# Patient Record
Sex: Female | Born: 1940 | Race: White | Hispanic: No | Smoking: Former smoker
Health system: Southern US, Community
[De-identification: ages and names within clinical notes are randomized; demographics above are authoritative.]

## PROBLEM LIST (undated history)

## (undated) ENCOUNTER — Emergency Department (HOSPITAL_COMMUNITY): Discharge status: Against Medical Advice

## (undated) DIAGNOSIS — E119 Type 2 diabetes mellitus without complications: Secondary | ICD-10-CM

## (undated) DIAGNOSIS — F419 Anxiety disorder, unspecified: Secondary | ICD-10-CM

## (undated) DIAGNOSIS — J449 Chronic obstructive pulmonary disease, unspecified: Secondary | ICD-10-CM

## (undated) DIAGNOSIS — J9610 Chronic respiratory failure, unspecified whether with hypoxia or hypercapnia: Secondary | ICD-10-CM

## (undated) HISTORY — DX: Chronic obstructive pulmonary disease, unspecified: J44.9

---
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
## 1981-02-02 HISTORY — PX: ABDOMINAL HYSTERECTOMY: SHX81

## 2002-06-22 ENCOUNTER — Encounter: Payer: Self-pay | Admitting: Internal Medicine

## 2002-10-10 ENCOUNTER — Inpatient Hospital Stay (HOSPITAL_COMMUNITY): Admission: AD | Admit: 2002-10-10 | Discharge: 2002-10-13 | Payer: Self-pay | Admitting: Internal Medicine

## 2002-10-10 ENCOUNTER — Encounter: Payer: Self-pay | Admitting: Critical Care Medicine

## 2004-01-03 ENCOUNTER — Ambulatory Visit: Payer: Self-pay | Admitting: Internal Medicine

## 2004-04-08 ENCOUNTER — Ambulatory Visit: Payer: Self-pay | Admitting: Internal Medicine

## 2004-07-09 ENCOUNTER — Ambulatory Visit: Payer: Self-pay | Admitting: Internal Medicine

## 2004-10-15 ENCOUNTER — Ambulatory Visit: Payer: Self-pay | Admitting: Internal Medicine

## 2005-01-14 ENCOUNTER — Ambulatory Visit: Payer: Self-pay | Admitting: Internal Medicine

## 2005-04-14 ENCOUNTER — Ambulatory Visit: Payer: Self-pay | Admitting: Internal Medicine

## 2005-07-14 ENCOUNTER — Ambulatory Visit: Payer: Self-pay | Admitting: Internal Medicine

## 2005-08-10 ENCOUNTER — Ambulatory Visit: Payer: Self-pay | Admitting: Pulmonary Disease

## 2005-08-25 ENCOUNTER — Ambulatory Visit: Payer: Self-pay | Admitting: Internal Medicine

## 2005-10-14 ENCOUNTER — Ambulatory Visit: Payer: Self-pay | Admitting: Internal Medicine

## 2005-10-21 ENCOUNTER — Ambulatory Visit: Payer: Self-pay | Admitting: Internal Medicine

## 2006-01-06 ENCOUNTER — Ambulatory Visit: Payer: Self-pay | Admitting: Internal Medicine

## 2006-02-22 ENCOUNTER — Ambulatory Visit: Payer: Self-pay | Admitting: Pulmonary Disease

## 2006-04-28 ENCOUNTER — Ambulatory Visit: Payer: Self-pay | Admitting: Internal Medicine

## 2006-07-30 ENCOUNTER — Ambulatory Visit: Payer: Self-pay | Admitting: Internal Medicine

## 2006-12-21 DIAGNOSIS — J4489 Other specified chronic obstructive pulmonary disease: Secondary | ICD-10-CM | POA: Insufficient documentation

## 2006-12-21 DIAGNOSIS — J449 Chronic obstructive pulmonary disease, unspecified: Secondary | ICD-10-CM | POA: Insufficient documentation

## 2006-12-28 ENCOUNTER — Telehealth (INDEPENDENT_AMBULATORY_CARE_PROVIDER_SITE_OTHER): Payer: Self-pay | Admitting: *Deleted

## 2007-01-12 ENCOUNTER — Ambulatory Visit: Payer: Self-pay | Admitting: Internal Medicine

## 2007-02-21 ENCOUNTER — Telehealth (INDEPENDENT_AMBULATORY_CARE_PROVIDER_SITE_OTHER): Payer: Self-pay | Admitting: *Deleted

## 2007-02-21 ENCOUNTER — Ambulatory Visit: Payer: Self-pay | Admitting: Internal Medicine

## 2007-02-21 DIAGNOSIS — J441 Chronic obstructive pulmonary disease with (acute) exacerbation: Secondary | ICD-10-CM

## 2007-07-13 ENCOUNTER — Ambulatory Visit: Payer: Self-pay | Admitting: Pulmonary Disease

## 2007-07-13 ENCOUNTER — Inpatient Hospital Stay (HOSPITAL_COMMUNITY): Admission: EM | Admit: 2007-07-13 | Discharge: 2007-07-18 | Payer: Self-pay | Admitting: Emergency Medicine

## 2007-07-21 ENCOUNTER — Ambulatory Visit: Admission: RE | Admit: 2007-07-21 | Discharge: 2007-07-21 | Payer: Self-pay | Admitting: Internal Medicine

## 2007-07-21 ENCOUNTER — Ambulatory Visit: Payer: Self-pay | Admitting: Internal Medicine

## 2007-07-28 ENCOUNTER — Ambulatory Visit: Payer: Self-pay | Admitting: Internal Medicine

## 2007-07-29 ENCOUNTER — Encounter: Payer: Self-pay | Admitting: Internal Medicine

## 2007-08-02 ENCOUNTER — Telehealth (INDEPENDENT_AMBULATORY_CARE_PROVIDER_SITE_OTHER): Payer: Self-pay | Admitting: *Deleted

## 2007-08-07 ENCOUNTER — Encounter: Payer: Self-pay | Admitting: Internal Medicine

## 2007-08-10 ENCOUNTER — Ambulatory Visit: Payer: Self-pay | Admitting: Internal Medicine

## 2007-08-10 DIAGNOSIS — R197 Diarrhea, unspecified: Secondary | ICD-10-CM | POA: Insufficient documentation

## 2007-08-17 ENCOUNTER — Telehealth: Payer: Self-pay | Admitting: Internal Medicine

## 2007-08-25 ENCOUNTER — Ambulatory Visit: Payer: Self-pay | Admitting: Internal Medicine

## 2007-08-26 LAB — CONVERTED CEMR LAB
BUN: 9 mg/dL (ref 6–23)
Basophils Relative: 0.7 % (ref 0.0–3.0)
Calcium: 9.4 mg/dL (ref 8.4–10.5)
Creatinine, Ser: 0.6 mg/dL (ref 0.4–1.2)
Eosinophils Absolute: 0 10*3/uL (ref 0.0–0.7)
GFR calc Af Amer: 129 mL/min
GFR calc non Af Amer: 106 mL/min
MCV: 94.8 fL (ref 78.0–100.0)
Monocytes Relative: 2 % — ABNORMAL LOW (ref 3.0–12.0)
Neutrophils Relative %: 88.8 % — ABNORMAL HIGH (ref 43.0–77.0)
Potassium: 4.5 meq/L (ref 3.5–5.1)
Sodium: 138 meq/L (ref 135–145)

## 2007-08-29 ENCOUNTER — Telehealth (INDEPENDENT_AMBULATORY_CARE_PROVIDER_SITE_OTHER): Payer: Self-pay | Admitting: *Deleted

## 2007-09-27 ENCOUNTER — Ambulatory Visit: Payer: Self-pay | Admitting: Internal Medicine

## 2007-09-30 ENCOUNTER — Encounter: Payer: Self-pay | Admitting: Internal Medicine

## 2007-10-18 ENCOUNTER — Ambulatory Visit: Payer: Self-pay | Admitting: Internal Medicine

## 2007-10-18 ENCOUNTER — Telehealth (INDEPENDENT_AMBULATORY_CARE_PROVIDER_SITE_OTHER): Payer: Self-pay | Admitting: *Deleted

## 2007-10-27 ENCOUNTER — Ambulatory Visit: Payer: Self-pay | Admitting: Internal Medicine

## 2007-10-28 ENCOUNTER — Encounter: Payer: Self-pay | Admitting: Pulmonary Disease

## 2007-11-09 ENCOUNTER — Telehealth (INDEPENDENT_AMBULATORY_CARE_PROVIDER_SITE_OTHER): Payer: Self-pay | Admitting: *Deleted

## 2007-11-10 ENCOUNTER — Ambulatory Visit: Payer: Self-pay | Admitting: Internal Medicine

## 2007-12-20 ENCOUNTER — Ambulatory Visit: Payer: Self-pay | Admitting: Internal Medicine

## 2008-02-01 ENCOUNTER — Ambulatory Visit: Payer: Self-pay | Admitting: Internal Medicine

## 2008-04-17 ENCOUNTER — Ambulatory Visit: Payer: Self-pay | Admitting: Internal Medicine

## 2008-07-24 ENCOUNTER — Ambulatory Visit: Payer: Self-pay | Admitting: Internal Medicine

## 2008-10-24 ENCOUNTER — Ambulatory Visit: Payer: Self-pay | Admitting: Internal Medicine

## 2009-01-08 ENCOUNTER — Encounter: Payer: Self-pay | Admitting: Internal Medicine

## 2009-01-08 ENCOUNTER — Telehealth (INDEPENDENT_AMBULATORY_CARE_PROVIDER_SITE_OTHER): Payer: Self-pay | Admitting: *Deleted

## 2009-02-05 ENCOUNTER — Encounter: Payer: Self-pay | Admitting: Internal Medicine

## 2009-04-19 ENCOUNTER — Ambulatory Visit: Payer: Self-pay | Admitting: Internal Medicine

## 2010-03-06 NOTE — Letter (Signed)
Summary: CMN for Oxygen/[facility]  CMN for Oxygen/[facility]   Imported By: Sherian Rein 02/08/2009 11:07:05  _____________________________________________________________________  External Attachment:    Type:   Image     Comment:   External Document

## 2010-03-06 NOTE — Assessment & Plan Note (Signed)
Summary: Pulmonary/ f/u yearly is ok   Primary Provider/Referring Provider:  Jeannetta Nap  CC:  Followup.  Pt states that her breathing has been doing well.  She states that her cough has been worsening since Dec 2010.  Cough is prod with cream colored sputum.Marland Kitchen  History of Present Illness:  67-yowf quit smoking Dec 96 with severe but well compensated COPD  Baseline activity level: grocery shopping, light housework, trash to curb. slow walking 100 ft without stopping. on average uses rescue 2x wk.   Had exacerbation with hospitalization  07/13/07 -07/18/07  required daily prednisone since then  November 10, 2007  ov:  sputum cleared to white taper prednisone to 10 mg one half daily  December 20, 2007 tol tapered to 10 mg one half daily, no more cough,  baseline  doe on 02  so decrease one half every other day but gradually getting worse esp energy so back up to ceiling of 20 and back to 5 mg per day.  02/01/08 ov maintaining prednisone floor of 10 mg one half daily with no change in dyspnea or cough.   April 17, 2008 ov mucus brown > white after levaquin and now prednione at 10mg  one daily but doe > baseline, congested cough > baseline, not following her med calendar continued see instructions because she is "just getting over some virus"  (? GI) . Patient failed to answer a single question asked in a straightforward manner, tending to go off on tangents or answer questions with ambiguous medical terms or diagnoses and seemed upset when asked the same question more than once for clarification.   July 24, 2008--Presents for follow up and med review. Brought all meds   October 24, 2008 found floor 10-5 works the best with only one episode where used 20 mg of prednsione per day since last ov.  April 19, 2009 Followup.  Pt states that her breathing has been doing well.  She states that her cough has been worsening since Dec 2010.  Cough is prod with cream colored sputum. Pt denies any significant sore  throat, dysphagia, itching, sneezing,  nasal congestion or excess secretions,  fever, chills, sweats, unintended wt loss, pleuritic or exertional cp, hempoptysis, change in activity tolerance  orthopnea pnd or leg swelling   Current Medications (verified): 1)  Oxygen 2 Liters .... Continuously 2)  Spiriva Handihaler 18 Mcg  Caps (Tiotropium Bromide Monohydrate) .... Inhale Contents of 1 Capsule Once A Day 3)  Protonix 40 Mg  Pack (Pantoprazole Sodium) .... Take 1 Tablet By Mouth Once A Day 4)  Symbicort 160-4.5 Mcg/act  Aero (Budesonide-Formoterol Fumarate) .... Inhale 2 Puffs Two Times A Day 5)  Centrum Silver   Tabs (Multiple Vitamins-Minerals) .... Take 1 Tablet By Mouth Once A Day 6)  Calcium 600-D 600-400 Mg-Unit  Tabs (Calcium Carbonate-Vitamin D) .Marland Kitchen.. 1 By Mouth Two Times A Day 7)  Paroxetine Hcl 10 Mg  Tabs (Paroxetine Hcl) .... 1/2 Tab By Mouth Once Daily 8)  Prednisone 10 Mg  Tabs (Prednisone) .... Take As Directed Once A Day or As Directed On Bottom of Medication Calendar 9)  Claritin 10 Mg Tabs (Loratadine) .... Take 1 Tab By Mouth At Bedtime As Needed 10)  Mycelex 10 Mg  Troc (Clotrimazole) .Marland Kitchen.. 1 Every 6 Hours As Needed For 7 Days 11)  Mucinex D [phone] Mg  Tb12 (Pseudoephedrine-Guaifenesin) .Marland Kitchen.. 1-2 Every 12 Hours As Needed 12)  Tylenol Extra Strength 500 Mg  Tabs (Acetaminophen) .... Take As Directed When  Needed 13)  Levaquin 750 Mg Tabs (Levofloxacin) .Marland Kitchen.. 1 By Mouth For 5 Days As Needed 14)  Flunisolide 0.025 % Soln (Flunisolide) .... 2 Puffs Every 12 Hours As Needed 15)  Proair Hfa 108 (90 Base) Mcg/act  Aers (Albuterol Sulfate) .... 2 Puffs Every Four Hours As Needed 16)  Flutter Valve .... Use Every 4 Hours As Needed 17)  Prednisone 10 Mg Tabs (Prednisone) .... 2 Tabs Daily Until Better Then 1 Tab Daily X7 Days, 1/2 Tab Daily 18)  Metformin Hcl 500 Mg Tabs (Metformin Hcl) .... 2 Every Am and 1 Every Evening  Allergies (verified): 1)  ! Vicodin 2)  ! Asa 3)  ! *  Patanol 4)  ! Sulfa 5)  ! Naprosyn  Past History:  Past Medical History: CHRONIC OBSTRUCTIVE PULMONARY DISEASE, SEVERE (ICD-496)   - PFTs 10/14/05 FEV1 23% ratio 23% diffusing capacity 51%   - no longterm vent 10/15/03 requested   - 02 dep 6/09   - prednisone dep since exac 6/09 Health Maintenance........................................................................Marland KitchenElkins   - Pneumovax 6/09   - Med calendar written 6/09  Vital Signs:  Patient profile:   70 year old female Weight:      110 pounds BMI:     18.37 O2 Sat:      93 % on 2 L/min Temp:     97.7 degrees F oral Pulse rate:   118 / minute BP sitting:   136 / 62  (left arm)  Vitals Entered By: Vernie Murders (April 19, 2009 2:16 PM)  O2 Flow:  2 L/min  Physical Exam  Additional Exam:  wt 120  Dec 20 2007 > 121 02/01/08 > 118 April 17, 2008 >>116 July 24, 2008 > 118 October 24, 2008  > 120 April 19, 2009   amb chronically ill minimally cushingnoid wf nad  HEENT mild turbinate edema.  Oropharynx no thrush or excess pnd or cobblestoning.  No JVD or cervical adenopathy. Mild accessory muscle hypertrophy. Trachea midline, nl thryroid. Chest was hyperinflated by percussion with diminished breath sounds and marked increased exp time without wheeze. Hoover sign positive onset of inspiration. Regular rate and rhythm without murmur gallop or rub or increase P2. Decrease s1s2  no edema Abd: no hsm, nl excursion. Ext warm without cyanosis or clubbing      Impression & Recommendations:  Problem # 1:  COPD UNSPECIFIED (ICD-496) Still steroid dependent   The goal with a chronic steroid dependent illness is always arriving at the lowest effective dose that controls the disease/symptoms and not accepting a set "formula" which is based on statistics that don't take into accound individual variability or the natural hx of the dz in every individual patient, which may well vary over time. For now ceiling and floor adjusted per  instructions  Each maintenance medication was reviewed in detail including most importantly the difference between maintenance and as needed and under what circumstances the prns are to be used.  In addition, these two groups of medications (for which the patient should keep up with refills) were distinguished from a third group, namely those meds that are used only short term with the intent to complete a course of therapy and then not refill them without further evaluation. See instructions for specific recommendations   Medications Added to Medication List This Visit: 1)  Levaquin 750 Mg Tabs (Levofloxacin) .Marland Kitchen.. 1 by mouth for 5 days as needed 2)  Metformin Hcl 500 Mg Tabs (Metformin hcl) .... 2 every am and 1 every  evening  Other Orders: Est. Patient Level III (04540)  Patient Instructions: 1)  See calendar for specific medication instructions and bring it back for each and every office visit for every healthcare provider you see.  Without it,  you may not receive the best quality medical care that we feel you deserve.  2)  ceiling for prednisone is 20 mg per day and floor is 10 alternating with 5 per day Prescriptions: LEVAQUIN 750 MG TABS (LEVOFLOXACIN) 1 by mouth for 5 days as needed  #5 x 5   Entered and Authorized by:   Nyoka Cowden MD   Signed by:   Nyoka Cowden MD on 04/19/2009   Method used:   Electronically to        [facility]* (retail)       [address]       [place], [place]  [id number]       Ph: [phone]       Fax: [phone]   RxID:   [id number]

## 2010-04-28 ENCOUNTER — Encounter: Payer: Self-pay | Admitting: Internal Medicine

## 2010-04-29 ENCOUNTER — Encounter: Payer: Self-pay | Admitting: Internal Medicine

## 2010-04-29 ENCOUNTER — Ambulatory Visit (INDEPENDENT_AMBULATORY_CARE_PROVIDER_SITE_OTHER): Payer: Medicare Other | Admitting: Internal Medicine

## 2010-04-29 VITALS — BP 130/60 | HR 125 | Temp 97.9°F | Ht 65.0 in | Wt 112.8 lb

## 2010-04-29 DIAGNOSIS — J449 Chronic obstructive pulmonary disease, unspecified: Secondary | ICD-10-CM

## 2010-04-29 MED ORDER — LEVOFLOXACIN 750 MG PO TABS: 750.0000 mg | ORAL_TABLET | Freq: Every day | ORAL | Status: AC

## 2010-04-29 NOTE — Progress Notes (Signed)
  Subjective:    Patient ID: Darlene [name], female    DOB: May 02, 1940, 70 y.o.   MRN: [id number]  HPI  85 yowf  quit smoking Dec 96 with severe but well compensated COPD Baseline activity level: grocery shopping, light housework, trash to curb. slow walking 100 ft without stopping. on average uses rescue 2x wk.  Had exacerbation with hospitalization 07/13/07 -07/18/07 required daily prednisone since then    December 20, 2007 tol tapered to 10 mg one half daily, no more cough, baseline doe on 02 so decrease one half every other day but gradually getting worse esp energy so back up to ceiling of 20 and back to 5 mg per day.   02/01/08 ov maintaining prednisone floor of 10 mg one half daily with no change in dyspnea or cough.    July 24, 2008--Presents for follow up and med review. Brought all meds   October 24, 2008 found floor 10-5 works the best with only one episode where used 20 mg of prednsione per day since last ov > no change rx  April 19, 2009 Followup. Pt states that her breathing has been doing well.  rec no change rx  04/29/2010 ov cc 02 dep resp failure @ 2lpm with floor of prednisone 11-06-08 and minimal use of prns but persistent congested cough not fully utilizing med calendar prns in max rec doses.   Pt denies any significant sore throat, dysphagia, itching, sneezing,  nasal congestion or excess/ purulent secretions,  fever, chills, sweats, unintended wt loss, pleuritic or exertional cp, hempoptysis, orthopnea pnd or leg swelling.    Also denies any obvious fluctuation of symptoms with weather or environmental changes or other aggravating or alleviating factors.       Past Medical History:  CHRONIC OBSTRUCTIVE PULMONARY DISEASE, SEVERE (ICD-496)  - PFTs 10/14/05 FEV1 23% ratio 23% diffusing capacity 51%  - no longterm vent 10/15/03 requested  - 02 dep 07/2007  - prednisone dep since exac 6/09  Health  Maintenance........................................................................Marland KitchenElkins  - Pneumovax 07/2007 - Med calendar written 6/09         Review of Systems     Objective:   Physical Exam    wt 120 Dec 20 2007 > 121 02/01/08 > 118 April 17, 2008 >> > 120 April 19, 2009 > 112 04/29/2010  amb chronically ill minimally cushingnoid wf nad  HEENT mild turbinate edema. Oropharynx no thrush or excess pnd or cobblestoning. No JVD or cervical adenopathy. Mild accessory muscle hypertrophy. Trachea midline, nl thryroid. Chest was hyperinflated by percussion with diminished breath sounds and marked increased exp time without wheeze. Hoover sign positive onset of inspiration. Regular rate and rhythm without murmur gallop or rub or increase P2. Decrease s1s2 no edema Abd: no hsm, nl excursion. Ext warm without cyanosis or clubbing    Assessment & Plan:

## 2010-04-29 NOTE — Patient Instructions (Signed)
See calendar for specific medication instructions and bring it back for each and every office visit for every healthcare provider you see.  Without it,  you may not receive the best quality medical care that we feel you deserve.  You will note that the calendar groups together  your maintenance  medications that are timed at particular times of the day.  Think of this as your checklist for what your doctor has instructed you to do until your next evaluation to see what benefit  there is  to staying on a consistent group of medications intended to keep you well.  The other group at the bottom is entirely up to you to use as you see fit  for specific symptoms that may arise between visits that require you to treat them on an as needed basis.  Think of this as your action plan or "what if" list.   Separating the top medications from the bottom group is fundamental to providing you adequate care going forward.    Bring you medications with you (all active medications !) when you see Tammy in 3 months and she will write a new calendar for you.

## 2010-04-29 NOTE — Assessment & Plan Note (Addendum)
GOLD IV 02 and steroid dependent.  I had an extended discussion with the patient today lasting 15 to 20 minutes of a 25 minute visit on the following issues:  The goal with a chronic steroid dependent illness is always arriving at the lowest effective dose that controls the disease/symptoms and not accepting a set "formula" which is based on statistics or guidelines that don't always take into account patient  variability or the natural hx of the dz in every individual patient, which may well vary over time.  For now therefore I recommend the patient maintain  For now the floor is 11-06-08. The ceiling is 20 mg per day.    Each maintenance medication was reviewed in detail including most importantly the difference between maintenance and as needed and under what circumstances the prns are to be used.   This was done in the context of a medication calendar review which provided the patient with a user-friendly unambiguous mechanism for medication administration and reconciliation and provides an action plan for all active problems. It is critical that this be shown to every doctor  for modification during the office visit if necessary so the patient can use it as a working document.   She needs to implement max doses and understand how to use the prns better.  See instructions  .

## 2010-05-09 ENCOUNTER — Other Ambulatory Visit: Payer: Self-pay | Admitting: Internal Medicine

## 2010-05-13 ENCOUNTER — Telehealth: Payer: Self-pay | Admitting: Internal Medicine

## 2010-05-13 MED ORDER — TIOTROPIUM BROMIDE MONOHYDRATE 18 MCG IN CAPS: ORAL_CAPSULE | RESPIRATORY_TRACT | Status: DC

## 2010-05-13 NOTE — Telephone Encounter (Signed)
Advised pt rx was sent to pharmacy and she verbalized understanding

## 2010-06-10 ENCOUNTER — Other Ambulatory Visit: Payer: Self-pay | Admitting: Internal Medicine

## 2010-06-17 NOTE — H&P (Signed)
NAMEHARVEY, LINGO NO.:  [id number]   MEDICAL RECORD NO.:  [id number]          PATIENT TYPE:  EMS   LOCATION:  ED                           FACILITY:  [facility]   PHYSICIAN:  Oretha Milch, MD      DATE OF BIRTH:  01/28/1941   DATE OF ADMISSION:  07/13/2007  DATE OF DISCHARGE:                              HISTORY & PHYSICAL   REFERRING PHYSICIAN:  Emergency room.   REASON FOR ADMISSION:  COPD exacerbation.   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  PFTs in September, 2007  have shown severe GOLD stage IV with an FEV1 of 23% (1.3 liters).  She  has been maintained on a regimen of Spiriva, Symbicort, and ProAire for  breakthrough symptoms.  The last exacerbation was in January, 2009,  which improved with steroids and antibiotics.  She quit smoking in  December, 1996 after a bronchitic exacerbation but was smoking about  three packs per day at the time of quitting.  She smoked for about 32  years.  She got sick a few days ago with a URI, which she believes she  may have contracted from another gentleman in a doctor's office.  It has  since settled in her chest and caused chest tightness and wheezing from  the past day.  She called the office but apparently could not be worked  in for an appointment.   On arrival today, patient was significantly short of breath.  Blood  pressure 157/95, heart rate 132.  She received 125 mg of Solu-Medrol,  400 mg of IV Avelox.  She has received two albuterol nebs in the past  four hours and an Atrovent neb.  She states that her dyspnea is somewhat  improved but complains of lightheadedness.  She is worried that her  heart rate and blood pressure are elevated.  A spiral CT was performed  which showed severe central lobular emphysema and faint ground glass  infiltrate in lingula.   PAST MEDICAL HISTORY:  Severe COPD, as described above.   ALLERGIES:   VICODIN, ASPIRIN, SULFA, NAPROXEN, PANTANOL.   FAMILY HISTORY:  Diabetes 2 in her mother.  One maternal uncle with  emphysema, who smoked.   CURRENT MEDICATIONS:  1. Spiriva HandiHaler daily.  2. Symbicort 160/4.5 2 puffs twice daily.  3. Protonix 40 mg once daily, which she doubles up when she takes      prednisone.  4. ProAire HFA as needed.  5. Oral Menest 0.625 mg once daily.  6. Centrum oral.  7. Nizoral 29 mcg daily.   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  Smoked for 30 years, three packs per day.   REVIEW OF SYSTEMS:  Reports lightheadedness.  Denies palpitations, chest  pain, paroxysmal nocturnal dyspnea, or orthopnea.  Reports no sleep.  Dry and occasionally low phlegm.  Denies abdominal pain.   PHYSICAL EXAMINATION:  A tall woman sitting up in bed in moderate  respiratory distress.  Heart rate 123 per  minute, sinus on the monitor, blood pressure 128/88,  respirations 22 per minute.  Oxygen saturation 95% on 2 liters nasal  cannula.  HEENT:  Erythematous pharynx.  No postnasal drip.  No exudates.  No  thrush.  NECK:  Supple.  No JVD.  No lymphadenopathy.  CVS:  S1 and S2 tachy.  Distant.  CHEST:  Decreased breath sounds above the bases.  A few crackles in  bases and faint scattered rhonchi.  ABDOMEN:  Soft and nontender, no organomegaly.  NEUROLOGIC:  Nonfocal.  EXTREMITIES:  No edema.   LAB DATA:  Sodium 134, potassium 4.2, chloride 100, BUN and creatinine 7  and 0.8, glucose 121.  WBC count 6.9, hemoglobin 15.9, platelets 209,  MCV 93, 82% segs.  Troponin 0.05.   IMPRESSION:  1. Chronic obstructive pulmonary disease exacerbation.  2. I am not impressed with the lingular ground glass infiltrates      described on the spiral CT scan.  3. Severe GOLD (global obstructive lung disease) stage IV chronic      obstructive pulmonary disease.  4. Sinus tachycardia related to work of breathing and albuterol      nebulizers.    RECOMMENDATIONS:  1. We will admit her to telemetry, given her lightheadedness and      significant tachycardia.  2. IV Solu-Medrol 60 mg q.8h.  3. IV Avelox 400 mg daily for bronchitis.  4. We will continue her Symbicort Spiriva.  Will use albuterol nebs 3      times a day and q.4h. as needed.  5. A 12-lead EKG will be checked.  6. We will provide her with a bedside commode to prevent      decompensation when she has to get out of the bed.  7. Subcu Lovenox will be used for DVT prophylaxis.  We will double up      her Protonix or GI prophylaxis while she is on steroids, based on      her prior experience.      Oretha Milch, MD  Electronically Signed     RVA/MEDQ  D:  07/13/2007  T:  07/13/2007  Job:  [id number]

## 2010-06-17 NOTE — Discharge Summary (Signed)
[name], [name]                  ACCOUNT NO.:  [id number]   MEDICAL RECORD NO.:  [id number]          PATIENT TYPE:  INP   LOCATION:  1414                         FACILITY:  [facility]   PHYSICIAN:  Casimiro Needle B. Sherene Sires, MD, FCCPDATE OF BIRTH:  24-Mar-1940   DATE OF ADMISSION:  07/13/2007  DATE OF DISCHARGE:  07/18/2007                               DISCHARGE SUMMARY   DISCHARGE DIAGNOSIS:  Acute on chronic respiratory failure secondary to  emphysema with acute exacerbation and now hypoxic respiratory failure,  oxygen dependent.   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  Pulmonary function  tests in September 2007 have shown severe GOLD stage IV with FEV1 of 23%  1.3 liters.  She is maintained on pharmaceutical regimen as noted.  She  has a long history of smoking.  She presented to the office on July 13, 2007, with acute exacerbation of her lung disease, was admitted for  further evaluation and treatment.   LABORATORY DATA:  Note she had CT performed which showed severe central  lobar emphysema and faint ground glass infiltrates in the lingula.   Hemoglobin 13.6, hematocrit 39.5, wbc 9.4, platelets 190.  Sodium 140,  potassium 4.3, chloride 101, CO2 34, BUN 20, creatinine 0.88, glucose  183, calcium 8.8.  Influenza A and B were both negative.  CK-MB was less  than 0.05, CK-MB was 1.8.  Myoglobin is 182.  D-dimer was noted to be  0.73.   HOSPITAL COURSE BY DISCHARGE DIAGNOSIS:  Acute on chronic respiratory  failure with severe emphysema with acute exacerbation and now hypoxic  respiratory failure on current oxygen.  Darlene [name] was admitted to [facility] and treated with the usual pharmaceutical interventions of  IV steroids, IV antibiotics.  She reached maximum hospital benefit by  July 18, 2007.  She was ambulated in the hall prior to discharge and  found on room air to desaturate to 84%.   Therefore, she will be  discharged on home O2 at 2 liters.  She will follow up with Dr. Sandrea Hughs on July 21, 2007, for further evaluation and treatment.   DISCHARGE MEDICATIONS:  1. Spiriva 18 mcg inhaled once daily.  2. Nasarel 2 twice daily  3. Protonix 40 mg daily twice a day if on prednisone.  4. Symbicort 164.5 twice daily.  5. Centrum 1 tablet daily.  6. ProAir HFA as need every 6 hours.  7. Menest 0.625 mg daily.  8. She has continued to use the Nystatin she has at home as      instructed.  9. Home O2 at 2 liters 24 hours a day.  10.Avelox 400 mg one a day until gone.  11.Prednisone on a tapered 30 mg x3 days, 20 mg x3 days, 10 mg x3      days, then stop.   DIET:  She should be on a regular diet.   FOLLOW UP:  She will follow up with Dr. Sandrea Hughs on July 16, 2007,  at 3:15 p.m.  Devra Dopp, MSN, ACNP      Darlene [name]. Sherene Sires, MD, [facility]  Electronically Signed    SM/MEDQ  D:  07/18/2007  T:  07/18/2007  Job:  [id number]

## 2010-06-17 NOTE — Assessment & Plan Note (Signed)
Darlene [name]                             PULMONARY OFFICE NOTE   NAME:[name], [name]                         MRN:          [id number]  DATE:07/30/2006                            DOB:          [birth year]    HISTORY:  A 70 year old white female who is presumed to have  predominantly emphysematous COPD by PFTs as recently as September 2007,  but who has significantly improved on Symbicort 160/4.5 two puffs b.i.d.  over Advair (some of this benefit may have been the lack of Advair's  adverse effect on the upper airway, however).  However, she comes back  all smiles today stating that she rarely needs any albuterol now and is  able to enjoy ADLs as long as she takes her time.   She denies any exertional chest pain, orthopnea, PND, or leg swelling,  fevers, chills, sweats, or nocturnal respiratory complaints.   For full inventory of medications, please see face sheet dated July 30, 2006.   PHYSICAL EXAMINATION:  She is a pleasant, but slightly anxious  ambulatory white female in no acute distress.  VITAL SIGNS:  Stable vital signs.  HEENT:  Unremarkable.  Oropharynx is clear.  LUNGS:  The lung fields reveal diminished breath sounds bilaterally but  no wheezing.  CARDIAC:  Regular rate and rhythm without murmur, gallop, or rub.  ABDOMEN:  Soft and benign.  EXTREMITIES:  Warm without calf tenderness, cyanosis, clubbing, or  edema.   MDI technique is reviewed and now at baseline is over 75%, and with  coaching it approaches 90% effective.   IMPRESSION:  Chronic obstructive pulmonary disease with an apparent  asthmatic component.  However, if she remains at her present level for 3  months, I would not hesitate to reduce the Symbicort down if not off  using the less is more strategy of treating what is predominantly  emphysematous chronic obstructive pulmonary disease, that is I am not  convinced that Symbicort actually helps to the extent that the  upper  airway was being irritated by the Advair, causing her respiratory  symptoms that improved once we stopped the Advair.   However, she is doing so well now I am reluctant to rock the boat and,  therefore, we will see her back in 6 months, sooner for any  exacerbations.     Charlaine Dalton. Sherene Sires, MD, Darlene [name]  Electronically Signed   MBW/MedQ  DD: 07/30/2006  DT: 07/30/2006  Job #: [id number]   cc:   Darlene [name], M.D.

## 2010-06-20 NOTE — H&P (Signed)
Darlene [name], Darlene [name]                            ACCOUNT NO.:  [id number]   MEDICAL RECORD NO.:  [id number]                   PATIENT TYPE:  INP   LOCATION:  0363                                 FACILITY:  [facility]   PHYSICIAN:  Shan Levans, M.D. [facility]            DATE OF BIRTH:  11-Jul-1940   DATE OF ADMISSION:  10/10/2002  DATE OF DISCHARGE:                                HISTORY & PHYSICAL   CHIEF COMPLAINT:  Shortness of breath.   HISTORY OF PRESENT ILLNESS:  A 70 year old white female ex-smoker since 1996  had presented to Dr. Sherene Sires in May 2004, with progressive shortness of breath,  getting worse and worse over time, dating back several years.  Previous FEV1  down to 35% 12 years ago, and a more recent FEV1 that was in the 24% range.  She had been started empirically on Advair 250/50 one spray b.i.d. in May  2004 and stated initially she had a good response to this, and also a Depo-  Medrol injection was given as well in May 2004, and also she did well in  July 2004, but then noted increasing symptomatology over the past several  weeks with increasing chest discomfort, chest tightness, hoarseness,  difficulty getting a breath in and out, denies really any reflux symptoms at  this time, and she is in fact on the Protonix at 40 mg daily.  She saw her  primary care physician last Friday.  He gave the patient a Depo-Medrol  injection and refilled her Advair.  She states that she was a little bit  better over the weekend, but now worse again today.  She really has no  cough.  She feels like her throat is closing off, her voice is giving out,  symptoms getting progressively worse.  She came to the office in some degree  of distress and with some element of anxiety overlay.  The patient is  admitted for further inpatient care.   PAST MEDICAL HISTORY:  1. Significant for appendectomy in 1960.  2. Hysterectomy in 1983.   ALLERGIES:  1. VICODIN.  2. ASPIRIN.  3. PATANOL EYE DROPS.  4.  SULFA.  5. NAPROSYN.   MEDICATIONS CURRENTLY:  1. Protonix 40 mg daily.  2. Menest 0.625 mg daily.  3. Atrovent inhaler two sprays q.i.d.  4. Advair 250/50 one spray b.i.d.  5. Paxil 5 mg daily.   SOCIAL HISTORY:  She quit smoking in 1996.  Retired.  No unusual travel,  pet, or hobby exposure.   FAMILY HISTORY:  Negative for respiratory disease.   REVIEW OF SYSTEMS:  Noncontributory.   PHYSICAL EXAMINATION:  GENERAL:  This is a thin, anxious white female in  mild respiratory distress using some purse lip breathing.  VITAL SIGNS:  Temperature 98, blood pressure 130/84, pulse 100, sat 95% room  air.  CHEST:  Very diminished breath sounds, expired wheezes with force  exhalation.  With purse lip breathing, the wheezes do not appear to go away  completely, but are partially relieved.  There is a significant component of  upper airway obstruction as well with upper airway wheezing ausculted.  No  rales are noted, no rhonchi.  CARDIAC:  Resting tachycardia without S3, normal S1 and S2.  ABDOMEN:  Soft, nontender.  EXTREMITIES:  No edema or clubbing.  NEUROLOGIC:  Intact.  HEENT:  No jugular venous distention or lymphadenopathy.  Oropharynx clear.  NECK:  Supple, no lymphadenopathy.  SKIN:  Clear.   LABORATORY DATA:  All pending at time of this dictation.   IMPRESSION AND PLAN:  Lower airway obstruction with progressive airway  inflammation and acute on chronic respiratory failure in the setting of  upper airway obstruction with vocal cord dysfunction and anxiety overlay.  It is difficult for me to sort out how much of this is upper versus lower  airway inflammation.  The patient is in enough distress, and I am uncertain  enough in her care that I believe at least an overnight admission to [facility] would be in order to provide for the patient IV Solu-Medrol  and up regulate her Paxil dosage, and evaluate her for further angiolytic  therapy on an inpatient basis.  We  will also obtain arterial blood gases,  chest x-ray, and the usual assortment of admission labs.                                               Shan Levans, M.D. [facility]    PW/MEDQ  D:  10/10/2002  T:  10/10/2002  Job:  [id number]   cc:   Windle Guard, M.D.  [address]  [place], [place] [address]  Fax: [phone]   Charlaine Dalton. Sherene Sires, M.D. [facility]

## 2010-06-20 NOTE — Assessment & Plan Note (Signed)
[name]                             PULMONARY OFFICE NOTE   Darlene [name], Darlene [name]                         MRN:          [id number]  DATE:02/22/2006                            DOB:          [birth year]    HISTORY OF PRESENT ILLNESS:  Patient is a 70 year old white female  patient of Dr. Thurston Hole with a known history of severe COPD with a  baseline FEV1 of only 23% of the predicted. Patient presents here today  for a 6-week followup and to review medications. Patient is on multiple  medications, has had several recent changes. The patient has brought all  of her medications today and is noted to be on a multivitamin and  multiple vitamin supplements including magnesium, vitamin E, and several  B vitamins. Patient states she is in her usual state of health and has  not had any chest pain, cough, fever, or leg swelling.   PAST MEDICAL HISTORY:  Reviewed.   CURRENT MEDICATIONS:  Reviewed.   PHYSICAL EXAMINATION:  Patient is a pleasant female in no acute  distress. She is afebrile with stable vital signs. O2 saturation is 94%  on room air.  HEENT: Unremarkable.  NECK: Supple without adenopathy, no JVD.  LUNG SOUNDS: Reveal diminished breath sounds at the bases, otherwise  clear.  CARDIAC: Regular rate.  ABDOMEN: Soft.  EXTREMITIES: Warm without any edema.   IMPRESSION AND PLAN:  1. Severe chronic obstructive pulmonary disease with an FEV1 of only      23% of the predicted. Patient is well compensated on her present      regimen. We will continue her present medical medication regimen      and follow back up with Dr. Sherene Sires as scheduled in 3 months or sooner      if needed.  2. Complex medication regimen. Have recommended that the patient      maintain on a multivitamin and discontinue her magnesium, vitamin      E, and B vitamins. This will help      consolidate her medication list much better.  A computerized      medication calendar was completed  with this patient with patient      education provided.     Rubye Oaks, NP  Electronically Signed      Charlaine Dalton. Sherene Sires, MD, [facility]  Electronically Signed   TP/MedQ  DD: 02/23/2006  DT: 02/23/2006  Job #: [id number]

## 2010-06-20 NOTE — Assessment & Plan Note (Signed)
[facility]                             PULMONARY OFFICE NOTE   Darlene [name], Darlene [name]                         MRN:          [id number]  DATE:01/06/2006                            DOB:          05/26/1940    HISTORY:  This is a 70 year old white female with severe COPD with a  baseline FEV1 of only 23% predicted documented in September, 2007.  Returns for followup on both Advair with no need for albuterol or  Mucinex since her previous visit.  For a full list of all her  medications please see patient's list dated 01/06/2006.   She denies dyspnea with slow ADLs, but cannot get out in the cold at  all due to dyspnea.  She denies any significant chest pain, cough,  fever, chills, sweats, PND or leg swelling.   PHYSICAL EXAMINATION:  GENERAL APPEARANCE:  She is a stoic and somber,  but not overtly depressed, ambulatory white female in no acute distress.  VITAL SIGNS:  She is afebrile with normal vital signs.  HEENT:  Oropharynx is clear.  LUNG:  Lung fields are diminished with hyperresonant to percussion but  no wheezing. There is marked increased in expiratory time.  HEART:  Regular rhythm without murmurs, rubs or gallops.  ABDOMEN:  Soft, flat, benign.  EXTREMITIES:  Warm without edema/clubbing.  Soft, benign with positive Hoover's sign immediately upon inspiration.   IMPRESSION:  Severe chronic obstructive pulmonary disease with an FEV1  less than 30% predicted, but nevertheless, relatively well compensated  on her present regimen.  No ischemic, asthmatic or bronchial component.   RECOMMENDATIONS:  Followup in 6-8 weeks for medication reconciliation  and generation of a medication calendar.  We will see her here in the  pulmonary clinic every 3 to 4 months or sooner if needed.     Charlaine Dalton. Sherene Sires, MD, [facility]  Electronically Signed    MBW/MedQ  DD: 01/06/2006  DT: 01/07/2006  Job #: [id number]   cc:   Windle Guard, M.D.

## 2010-06-20 NOTE — Assessment & Plan Note (Signed)
[place] [facility]                             PULMONARY OFFICE NOTE   SHAYLINN, HLADIK                         MRN:          [id number]  DATE:04/28/2006                            DOB:          1940/03/16    PULMONARY EXTENDED FOLLOW-UP OFFICE VISIT:  A 70 year old white female  with severe emphysematous COPD with an FEV1 of only 23% predicted,  documented on October 14, 2005.  She had been doing relatively  well  on a combination of Spiriva plus Advair 250/50 1 b.i.d. but caught a  cold several months ago and is just not back to normal.  Her main  complaint is one of upper airway congestion with minimally thick mucus  production, increased dyspnea over baseline.  She does use albuterol  more than she usually does and gets benefit but not for more than a few  hours at a time.   Interestingly, on her medicine calendar, which is supposed to represent  a detailed inventory of all of her maintenance and p.r.n.'s, albuterol  is not listed.   PHYSICAL EXAMINATION:  GENERAL:  She is an anxious white female in no  acute distress.  She does clear her throat frequently during the  interview and exam.  HEENT:  Unremarkable.  Her oropharynx is clear.  LUNGS:  Diminished breath sounds.  HEART:  Regular rhythm without murmur, rub or gallop.  ABDOMEN:  Soft, benign.  EXTREMITIES:  Warm without calf tenderness, clubbing, cyanosis or edema.   Saturation 94% on room air.   IMPRESSION:  Chronic obstructive pulmonary disease with minimum  asthmatic component.  I believe her upper airway congestion is nothing  more than the adverse effect of Advair on the upper airway (versus  poorly controlled reflux versus both).  The notes indicate that  I had  tried her on Advair HFA, but she did not think this helped.  I now  realize that the Advair HFA has the same irritation in the upper airway  because of its large particle size as the Advair DPI, and since she did  not  tolerate either, I am going to switch her over to Symbicort 160/4.5  two puffs b.i.d.   Her baseline MDI technique was only about 30% effective but with  coaching approached 60-70%.  I have asked her to try the sample of  Symbicort and see if she notices a reduction in symptoms as well as need  for albuterol use.   I have updated her medication calendar and asked her to show it to Dr.  Jeannetta Nap on every visit to make sure we are all reading from the same  page in terms of which medicine she is using, emphasizing that if it is  not on the list, she should not be taking it, and if it is on the list,  she should take it as written.  Otherwise, certainly she is at risk of  adverse medication interactions at outcomes.   Followup will be every three months, sooner if needed.     Charlaine Dalton. Sherene Sires, MD, [facility]  Electronically Signed  MBW/MedQ  DD: 04/28/2006  DT: 04/28/2006  Job #: [id number]   cc:   Windle Guard, M.D.

## 2010-06-20 NOTE — Assessment & Plan Note (Signed)
[place] [facility]                               PULMONARY OFFICE NOTE   NAME:Darlene [name], Darlene [name]                         MRN:          [id number]  DATE:08/25/2005                            DOB:          July 04, 1940    HISTORY OF PRESENT ILLNESS:  She is here for a two week followup after being  seen by Zenia Resides, N.P., on August 10, 2005, and being treated for  asthmatic bronchitic flare with COPD component, also with a history of vocal  cord dysfunction and reflux.  She was treated with a prednisone taper,  Zithromax 500 mg daily, and she was changed to Advair HFA 11521.  She  presents today noting to have been treated for thrush by her primary care  physician with Mycostat which has now resolved.  She notes she is having  increased work of breathing and rattling cough, and notes that this came at  the same time when she switched from her discus Advair to the [facility] Advair.  She also has been placed back on the Protonix by her primary care physician  for suspected reflux.   PHYSICAL EXAMINATION:  GENERAL:  Mild rhonchi.  HEENT:  Oropharynx is unremarkable.  LUNGS:  Bilateral equal breath sounds are heard.  HEART:  Regular rate and rhythm.  EXTREMITIES:  Without edema.  VITAL SIGNS:  Blood pressure is 130/78, pulse is 102, O2 saturations are 96%  on room air, heart rate is 102.  Her weight is noted to be 116 pounds, up  one pound from previous office visit.   IMPRESSION AND PLAN:  Recent asthmatic bronchitic flare with chronic  obstructive pulmonary disease component.  Also, with gastroesophageal reflux  component.  She does not respond well to the change in her Advair,  therefore, she has been returned to her Advair 250/50 until this acute  bronchitic flare is totally resolved, and she can be restarted on HFA at a  later time when she is not having the complication of acute exacerbation  that ____________of whether or not she is responding well to a change in  her  asthma treatment.  She has been told she will continue her Protonix since it  is helping.  She is to schedule follow up appointment with Dr. Sherene Sires.  Of  note, her medications are available in front of her chart and have been  evaluated.  Again, she has been changed back to her Advair 250/50 as of August 25, 2005, and this will be evaluated by Dr. Sherene Sires when she follows up with  him on her scheduled visit.                                   Devra Dopp, MSN, ACNP                                Charlaine Dalton. Sherene Sires, MD, FCCP   SM/MedQ  DD:  08/25/2005  DT:  08/25/2005  Job #:  [id number]

## 2010-06-20 NOTE — Assessment & Plan Note (Signed)
[facility] [facility]                               PULMONARY OFFICE NOTE   NAME:Darlene [name], Darlene [name]                         MRN:          [id number]  DATE:10/21/2005                            DOB:          1941-01-08    HISTORY:  This 70 year old white female returns for followup evaluation of  severe COPD with minimal asthmatic component, maintained on a combination of  Advair 250/50 mcg 1 b.i.d. (she felt better on this than the Advair HFA),  and Spiriva 1 puff daily.  She says she rarely uses albuterol now and denies  any significant excess coughing, fever, chills, sweats, orthopnea, PND, or  leg swelling.   She returns after a short course of prednisone for an exacerbation of COPD  in July and states she is doing better, except with weather changes, but  could not identify anything specifically that was bothering her about the  weather changes.   PHYSICAL EXAMINATION:  GENERAL:  She is a depressed and anxious-appearing  ambulatory white female in no acute distress.  VITAL SIGNS:  See front of sheet for vital signs.  HEENT:  Unremarkable.  Pharynx:  Clear.  LUNG FIELDS:  Reveal diminished breath sounds bilaterally but no wheezing.  HEART:  Regular rate and rhythm without murmur, gallop, or rub.  ABDOMEN:  Soft, benign.  EXTREMITIES:  Normal without calf tenderness, cyanosis, clubbing, or edema.   IMPRESSION:  Chronic obstructive pulmonary disease with a minimal asthmatic  component that is well controlled on her present combination therapy with  Spiriva and Advair.  She does have albuterol to use p.r.n. dyspnea with  subjective wheezing, and Mucinex to use 1 to 2 every 12 hours p.r.n. cough  and congestion.  I told the patient that if she has a significant deviation  from her baseline in terms of both cough and dyspnea, that we would be happy  to see her in the office on a p.r.n. basis.  Otherwise, we will see her back  every 3 months.                     Charlaine Dalton. Sherene Sires, MD, [facility]   MBW/MedQ  DD:  10/21/2005  DT:  10/23/2005  Job #:  [id number]   cc:   Windle Guard, M.D.

## 2010-06-20 NOTE — Discharge Summary (Signed)
Darlene [name], Darlene [name]                            ACCOUNT NO.:  [id number]   MEDICAL RECORD NO.:  [id number]                   PATIENT TYPE:  INP   LOCATION:  0363                                 FACILITY[facility]  [facility]   PHYSICIAN:  Charlaine Dalton. Sherene Sires, M.D. [facility]           DATE OF BIRTH:  Apr 12, 1940   DATE OF ADMISSION:  10/10/2002  DATE OF DISCHARGE:  10/13/2002                                 DISCHARGE SUMMARY   FINAL DIAGNOSES:  1. Acute respiratory distress secondary to chronic obstructive pulmonary     disease exacerbation with asthmatic component.     A. Status post ___________in 1610.     B. No evidence of pneumonia or congestive heart failure by chest x-ray.  2. Chronic rhinitis with nasal congestion worsening, associated with chronic     obstructive pulmonary disease exacerbation.     A. No evidence of sinusitis by CT scan.     B. Started on Nasarel this admission.  3. Severe anxiety, contributing clinically to decompensation.  4. Possible gastroesophageal reflux disease with vocal cord dysfunction also     contributing and improved on high dose PPI therapy this admission.   HISTORY OF PRESENT ILLNESS:  Please see the H&P dictated by Dr. Delford Field. This  patient was admitted with COPD exacerbation, which occurred in the setting  of several weeks associated with chest tightness, hoarseness and failure to  respond to Depo-Medrol injections as an outpatient as well as outpatient  Advair. She did not have any cough but did have nasal congestion and a  complaint that she had a sensation that her throat was closing off. She  was treated as a COPD exacerbation and empirically given Avalox but never  had any purulent sputum. After 3 days of therapy the Avalox was stopped.  Chest x-ray failed to reveal any infiltrates and sinus CT scan failed to  reveal any evidence of cellulitis.   LABORATORY DATA:  Significant for a bicarb level of 31 and a non-fasting  blood sugar of 155.   The patient  appeared to respond to around-the-clock nebulizers, IV steroids  and was tapered to a p.o. regimen, today prior to discharge and was  ambulatory and shortness of breath only with exertion, although not quite  back to baseline. She was therefore felt to be satisfactory for discharge  with consistently adequate saturations for 24 hours prior to discharge, on  room air. The patient is therefore in improved condition and discharged on  the following medications:   1. Protonix 40 mg b.i.d.  2. Menest 0.625 mg 1 daily.  3. Advair 250 b.i.d.  4. Paxil 5 mg 1 daily.  5. Spiriva 18 mcg 1 q. a.m.  6. Prednisone 10 mg tablets, 40 b.i.d. for 2 days, 40 q. a.m. for 2 days,     then 2 daily until seen in the office within the next week.  7. She was treated  for rhinitis with Nasarel 2 puffs b.i.d. with Afrin for     the first 5 days only.  8.     She does have albuterol to use 2 puffs p.r.n. and the next logical step, in      terms of her workup, would be to provide her with a home nebulizer     although she had not used the nebulizer for 24 hours prior to discharge     and so therefore, is going to use Albuterol MDI for rescue purposes.                                               Charlaine Dalton. Sherene Sires, M.D. [facility]    MBW/MEDQ  D:  10/13/2002  T:  10/13/2002  Job:  [id number]   cc:   Windle Guard, M.D.  [address]  [place], [place] [address]  Fax: [phone]

## 2010-07-21 ENCOUNTER — Encounter: Payer: Medicare Other | Admitting: Adult Health

## 2010-08-05 ENCOUNTER — Other Ambulatory Visit: Payer: Self-pay | Admitting: Internal Medicine

## 2010-08-20 ENCOUNTER — Other Ambulatory Visit: Payer: Self-pay | Admitting: *Deleted

## 2010-08-20 DIAGNOSIS — J449 Chronic obstructive pulmonary disease, unspecified: Secondary | ICD-10-CM

## 2010-08-20 MED ORDER — PREDNISONE 10 MG PO TABS: ORAL_TABLET | ORAL | Status: DC

## 2010-10-30 LAB — CBC
HCT: 39.5
HCT: 40.4
Hemoglobin: 14.3
MCHC: 34.8
MCV: 93.1
MCV: 94.6
Platelets: 190
Platelets: 209
RBC: 4.32
RDW: 12.9
WBC: 3.8 — ABNORMAL LOW

## 2010-10-30 LAB — DIFFERENTIAL
Basophils Relative: 0
Eosinophils Absolute: 0
Monocytes Relative: 6
Neutrophils Relative %: 82 — ABNORMAL HIGH

## 2010-10-30 LAB — POCT I-STAT, CHEM 8
Creatinine, Ser: 0.8
Hemoglobin: 17 — ABNORMAL HIGH
Sodium: 134 — ABNORMAL LOW
TCO2: 30

## 2010-10-30 LAB — BASIC METABOLIC PANEL
BUN: 20
CO2: 34 — ABNORMAL HIGH
Chloride: 101
GFR calc non Af Amer: >60
Glucose, Bld: 183 — ABNORMAL HIGH
Glucose, Bld: 194 — ABNORMAL HIGH
Potassium: 3.3 — ABNORMAL LOW
Potassium: 4.3
Sodium: 138

## 2010-10-30 LAB — INFLUENZA A+B VIRUS AG-DIRECT(RAPID)
Inflenza A Ag: NEGATIVE
Influenza B Ag: NEGATIVE

## 2010-10-30 LAB — POCT CARDIAC MARKERS: Myoglobin, poc: 182

## 2010-11-11 ENCOUNTER — Other Ambulatory Visit: Payer: Self-pay | Admitting: Internal Medicine

## 2010-11-28 ENCOUNTER — Other Ambulatory Visit: Payer: Self-pay | Admitting: *Deleted

## 2010-11-28 MED ORDER — FLUNISOLIDE 25 MCG/ACT (0.025%) NA SOLN: 2.0000 | Freq: Two times a day (BID) | NASAL | Status: DC

## 2010-12-02 ENCOUNTER — Telehealth: Payer: Self-pay | Admitting: Internal Medicine

## 2010-12-02 NOTE — Telephone Encounter (Signed)
Ok to refill flonase and do the jury duty paperwork through the mail

## 2010-12-02 NOTE — Telephone Encounter (Signed)
Called and spoke with pt. She states that she needs MW to provide letter excusing her from jury duty. I advised that we will need her actual summons notice in order to do the letter. She states will mail this too [place] as it is too cold for her to go outside until weather gets warmer again in the spring. She states will need a refill on her fluticasone nasal spray soon and I advised that she is overdue for rov and so will need to check with MW. She states that she does not want to come back until March when the weather is warmer. Please advise, thanks!

## 2010-12-02 NOTE — Telephone Encounter (Signed)
Also, pt stated she received her medications in the mail today.  Pt got a note from the pharmacist stating that her flunisolide Nasal Spray requires an office visit.  However, pt stated her next visit is not due until March, 2013.  [name]

## 2010-12-03 ENCOUNTER — Other Ambulatory Visit: Payer: Self-pay | Admitting: Internal Medicine

## 2010-12-03 MED ORDER — TIOTROPIUM BROMIDE MONOHYDRATE 18 MCG IN CAPS: ORAL_CAPSULE | RESPIRATORY_TRACT | Status: DC

## 2010-12-03 NOTE — Telephone Encounter (Signed)
Spoke with pt and notified okay per MW to give her refills until March 2013. She states that she is not needing a refill yet, but will let us know if/when she does.

## 2010-12-09 ENCOUNTER — Encounter: Payer: Self-pay | Admitting: *Deleted

## 2010-12-11 ENCOUNTER — Telehealth: Payer: Self-pay | Admitting: Internal Medicine

## 2010-12-11 NOTE — Telephone Encounter (Signed)
Hold in leslie's box until she rec. Everything from pt----Pt aware to mail everything, summons and letter back to [facility] attn Vernie Murders and we will mail to court house for pt

## 2010-12-30 ENCOUNTER — Other Ambulatory Visit: Payer: Self-pay | Admitting: Internal Medicine

## 2011-01-07 NOTE — Telephone Encounter (Signed)
Darlene [name], has this been taken care of or are you still waiting on something from the patient? Pls advise.

## 2011-01-07 NOTE — Telephone Encounter (Signed)
Yes, I received the letter and mailed it to the court house

## 2011-05-06 ENCOUNTER — Ambulatory Visit (INDEPENDENT_AMBULATORY_CARE_PROVIDER_SITE_OTHER): Payer: Medicare Other | Admitting: Internal Medicine

## 2011-05-06 ENCOUNTER — Encounter: Payer: Self-pay | Admitting: Internal Medicine

## 2011-05-06 VITALS — BP 138/82 | HR 127 | Temp 97.7°F | Ht 65.0 in | Wt 113.4 lb

## 2011-05-06 DIAGNOSIS — J449 Chronic obstructive pulmonary disease, unspecified: Secondary | ICD-10-CM

## 2011-05-06 NOTE — Assessment & Plan Note (Signed)
-   PFTs 10/14/05 FEV1 23% ratio 23% diffusing capacity 51%  - no longterm vent 10/15/03 requested  - 02 dep 07/2007  - prednisone dep since exac 07/2007  GOLD IV and steroid/02 dep so very nearly endstage and nothing else to offer as declined cxr - concern was macrodantin side effect but doesn't plan to take this again anyway based on perceived systemic symptoms p rx    Each maintenance medication was reviewed in detail including most importantly the difference between maintenance and as needed and under what circumstances the prns are to be used.  Please see instructions for details which were reviewed in writing and the patient given a copy.    The goal with a chronic steroid dependent illness is always arriving at the lowest effective dose that controls the disease/symptoms and not accepting a set "formula" which is based on statistics or guidelines that don't always take into account patient  variability or the natural hx of the dz in every individual patient, which may well vary over time.  For now therefore I recommend the patient maintain  A ceiling of 20 mg per day and a floor of 10 mg

## 2011-05-06 NOTE — Patient Instructions (Signed)
Please schedule a follow up office visit in 6 weeks, call sooner if needed with CXR if possible

## 2011-05-06 NOTE — Progress Notes (Signed)
  Subjective:    Patient ID: Darlene [name], female    DOB: [birth year]   MRN: [id number]   Brief patient profile:  70 yowf  quit smoking Dec 96 with severe but well compensated COPD Baseline activity level: grocery shopping, light housework, trash to curb. slow walking 100 ft without stopping. on average uses rescue 2x wk.  Had exacerbation with hospitalization 07/13/07 -07/18/07 required daily prednisone since then    December 20, 2007 tol tapered to 10 mg one half daily, no more cough, baseline doe on 02 so decrease one half every other day but gradually getting worse esp energy so back up to ceiling of 20 and back to 5 mg per day.   02/01/08 ov maintaining prednisone floor of 10 mg one half daily with no change in dyspnea or cough.    July 24, 2008--Presents for follow up and med review. Brought all meds   October 24, 2008 found floor 10-5 works the best with only one episode where used 20 mg of prednsione per day since last ov > no change rx  April 19, 2009 Followup. Pt states that her breathing has been doing well.  rec no change rx  04/29/2010 ov cc 02 dep resp failure @ 2lpm with floor of prednisone 11-06-08 and minimal use of prns but persistent congested cough not fully utilizing med calendar prns in max rec doses. rec Return 3 months for med review/ new calendar   05/06/2011 f/u ov/Mauro Arps no using med calendar cc worse doe x room to room even on 02 2lpm continous since oct 2012 on 3 courses of macrodantin last course was end of Jan 2013 with ? Reaction at end of rx  = weak/ flu like.  New  Floor is 10 mg pred per day and max is 20 mg per day. No purulent sputum overt sinus or reflux symptom and minimally better with saba    Pt denies any significant sore throat, dysphagia, itching, sneezing,  nasal congestion or excess/ purulent secretions,  fever, chills, sweats, unintended wt loss, pleuritic or exertional cp, hempoptysis, orthopnea pnd or leg swelling.    Also denies any obvious  fluctuation of symptoms with weather or environmental changes or other aggravating or alleviating factors.       Past Medical History:  CHRONIC OBSTRUCTIVE PULMONARY DISEASE, SEVERE (ICD-496)  - PFTs 10/14/05 FEV1 23% ratio 23% diffusing capacity 51%  - no longterm vent 10/15/03 requested  - 02 dep 07/2007  - prednisone dep since exac 07/2007 Health Maintenance........................................................................Marland KitchenElkins  - Pneumovax 07/2007 - Med calendar written 07/2007               Objective:   Physical Exam    wt 120 Dec 20 2007 >   120 April 19, 2009 > 112 04/29/2010 > 05/06/2011  113 amb chronically ill minimally cushingnoid wf nad  HEENT mild turbinate edema. Oropharynx no thrush or excess pnd or cobblestoning. No JVD or cervical adenopathy. Mild accessory muscle hypertrophy. Trachea midline, nl thryroid. Chest was hyperinflated by percussion with diminished breath sounds and marked increased exp time without wheeze. Hoover sign positive onset of inspiration. Regular rate and rhythm without murmur gallop or rub or increase P2. Decrease s1s2 no edema Abd: no hsm, nl excursion. Ext warm without cyanosis or clubbing  cxr 05/06/2011 > refused  Assessment & Plan:

## 2011-05-21 ENCOUNTER — Other Ambulatory Visit: Payer: Self-pay | Admitting: Internal Medicine

## 2011-05-22 ENCOUNTER — Other Ambulatory Visit: Payer: Self-pay | Admitting: Internal Medicine

## 2011-05-22 MED ORDER — BUDESONIDE-FORMOTEROL FUMARATE 160-4.5 MCG/ACT IN AERO: 2.0000 | INHALATION_SPRAY | Freq: Two times a day (BID) | RESPIRATORY_TRACT | Status: DC

## 2011-06-16 ENCOUNTER — Other Ambulatory Visit: Payer: Self-pay | Admitting: Internal Medicine

## 2011-06-16 DIAGNOSIS — R0602 Shortness of breath: Secondary | ICD-10-CM

## 2011-06-17 ENCOUNTER — Ambulatory Visit (INDEPENDENT_AMBULATORY_CARE_PROVIDER_SITE_OTHER)
Admission: RE | Admit: 2011-06-17 | Discharge: 2011-06-17 | Disposition: A | Discharge status: Home / Self Care | Payer: Medicare Other | Source: Ambulatory Visit | Attending: Internal Medicine | Admitting: Internal Medicine

## 2011-06-17 ENCOUNTER — Encounter: Payer: Self-pay | Admitting: Internal Medicine

## 2011-06-17 ENCOUNTER — Ambulatory Visit (INDEPENDENT_AMBULATORY_CARE_PROVIDER_SITE_OTHER): Payer: Medicare Other | Admitting: Internal Medicine

## 2011-06-17 VITALS — BP 140/74 | HR 130 | Temp 98.4°F | Ht 65.0 in | Wt 113.8 lb

## 2011-06-17 DIAGNOSIS — R0602 Shortness of breath: Secondary | ICD-10-CM

## 2011-06-17 DIAGNOSIS — J449 Chronic obstructive pulmonary disease, unspecified: Secondary | ICD-10-CM

## 2011-06-17 NOTE — Patient Instructions (Addendum)
Work on Chemical engineer technique:  relax and gently blow all the way out then take a nice smooth deep breath back in, triggering the inhaler at same time you start breathing in.  Hold for up to 5 seconds if you can.  Rinse and gargle with water when done   If your mouth or throat starts to bother you,   I suggest you time the inhaler to your dental care and after using the inhaler(s) brush teeth and tongue with a baking soda containing toothpaste and when you rinse this out, gargle with it first to see if this helps your mouth and throat.     The flutter valve will help you get the mucus up easier.  Follow up here can be as needed  - you may wish to consider palliative care/ hospice at this point

## 2011-06-17 NOTE — Assessment & Plan Note (Signed)
-   PFTs 10/14/05 FEV1 23% ratio 23% diffusing capacity 51%  - no longterm vent 10/15/03 requested  - 02 dep 07/2007  - prednisone dep since exac 07/2007 - hfa 90% 06/17/2011   GOLD IV 02 dep probably endstage at this point  I had an extended discussion with the patient today lasting 15 to 20 minutes of a 25 minute visit on the following issues:   Though somewhat paradoxic, when the lung fails to clear C02 properly and pC02 rises the lung then becomes a more efficient scavenger of C02 allowing lower work of breathing and  better C02 clearance albeit at a higher serum pC02 level - this is why pts can look a lot better than their ABG's would suggest and why it's so difficult to prognosticate endstage dz.  It's also why I strongly rec DNI status (ventilating pts down to a nl pC02 adversely affects this compensatory mechanism)  She has agreed to DNI status but not sure she wants to start palliative care/ hospice at this point but will think about it.    Each maintenance medication was reviewed in detail including most importantly the difference between maintenance and as needed and under what circumstances the prns are to be used.  Please see instructions for details which were reviewed in writing and the patient given a copy.   The proper method of use, as well as anticipated side effects, of a metered-dose inhaler are discussed and demonstrated to the patient. Improved effectiveness after extensive coaching during this visit to a level of approximately  90%

## 2011-06-17 NOTE — Progress Notes (Signed)
Subjective:    Patient ID: Darlene [name], female    DOB: [birth year]   MRN: [id number]   Brief patient profile:  70 yowf  quit smoking Dec 96 with severe but well compensated COPD Baseline activity level: grocery shopping, light housework, trash to curb. slow walking 100 ft without stopping. on average uses rescue 2x wk.  Had exacerbation with hospitalization 07/13/07 -07/18/07 required daily prednisone since then    December 20, 2007 tol tapered to 10 mg one half daily, no more cough, baseline doe on 02 so decrease one half every other day but gradually getting worse esp energy so back up to ceiling of 20 and back to 5 mg per day.   02/01/08 ov maintaining prednisone floor of 10 mg one half daily with no change in dyspnea or cough.    July 24, 2008--Presents for follow up and med review. Brought all meds   October 24, 2008 found floor 10-5 works the best with only one episode where used 20 mg of prednsione per day since last ov > no change rx  April 19, 2009 Followup. Pt states that her breathing has been doing well.  rec no change rx  04/29/2010 ov cc 02 dep resp failure @ 2lpm with floor of prednisone 11-06-08 and minimal use of prns but persistent congested cough not fully utilizing med calendar prns in max rec doses. rec Return 3 months for med review/ new calendar   05/06/2011 f/u ov/Darlene [name] no using med calendar cc worse doe x room to room even on 02 2lpm continous since oct 2012 on 3 courses of macrodantin last course was end of Jan 2013 with ? Reaction at end of rx  = weak/ flu like.  New  Floor is 10 mg pred per day and max is 20 mg per day.  No change rx   06/17/2011 f/u ov/Darlene [name] cc no better off macrodantin on prednisone down 12.5 but no worse either- only goes out now to go to doctor, sob room to room. No purulent sputum, thinks she's doing so much better clearing it she's not using the flutter or mucinex as much.  No overt sinus or hb symptoms - not much better p saba but mostly  using daytime, not at hs  Sleeping ok at 30 degrees on pillows  without nocturnal  or early am exacerbation  of respiratory  c/o's or need for noct saba. Also denies any obvious fluctuation of symptoms with weather or environmental changes or other aggravating or alleviating factors except as outlined above    Pt denies any significant sore throat, dysphagia, itching, sneezing,  nasal congestion or excess/ purulent secretions,  fever, chills, sweats, unintended wt loss, pleuritic or exertional cp, hempoptysis, orthopnea pnd or leg swelling.    Also denies any obvious fluctuation of symptoms with weather or environmental changes or other aggravating or alleviating factors.       Past Medical History:  CHRONIC OBSTRUCTIVE PULMONARY DISEASE, SEVERE (ICD-496)  - PFTs 10/14/05 FEV1 23% ratio 23% diffusing capacity 51%  - no longterm vent 10/15/03 requested  - 02 dep 07/2007  - prednisone dep since exac 07/2007 Health Maintenance........................................................................Marland KitchenJeannetta [name]  - Pneumovax 07/2007 - Med calendar written 07/2007               Objective:   Physical Exam  Wt 120 Dec 20 2007 > 120 April 19, 2009 > 112 04/29/2010 > 05/06/2011  113> 06/17/2011  113 amb chronically ill minimally cushingnoid wf nad with rattlling congested cough  HEENT mild turbinate edema. Oropharynx no thrush or excess pnd or cobblestoning. No JVD or cervical adenopathy. Mild accessory muscle hypertrophy. Trachea midline, nl thryroid. Chest was hyperinflated by percussion with diminished breath sounds and marked increased exp time without wheeze. Hoover sign positive onset of inspiration. Regular rate and rhythm without murmur gallop or rub or increase P2. Decrease s1s2 no edema Abd: no hsm, nl excursion. Ext warm without cyanosis or clubbing  CXR  06/17/2011 :  Marked emphysema without acute disease.   Assessment & Plan:

## 2011-07-21 ENCOUNTER — Other Ambulatory Visit: Payer: Self-pay | Admitting: Internal Medicine

## 2011-12-14 ENCOUNTER — Telehealth: Payer: Self-pay | Admitting: Internal Medicine

## 2011-12-14 ENCOUNTER — Other Ambulatory Visit: Payer: Self-pay | Admitting: Internal Medicine

## 2011-12-14 MED ORDER — DOXYCYCLINE HYCLATE 100 MG PO TABS: 100.0000 mg | ORAL_TABLET | Freq: Two times a day (BID) | ORAL | Status: DC

## 2011-12-14 NOTE — Telephone Encounter (Signed)
Spoke with pt and notified of recs per MR She verbalized understanding Doxy was sent Pred she already has enough so this did not need to be sent She will call back for ov if not improving

## 2011-12-14 NOTE — Telephone Encounter (Signed)
BEst to Rx for AECOPD   - Take doxycycline 100mg  po twice daily x 5 days; take after meals and avoid sunlight   Please take Take prednisone 40mg  once daily x 3 days, then 30mg  once daily x 3 days, then 20mg  once daily x 3 days, then prednisone 10mg  once daily  x 3 days and stop or go to baseline floor scheduled dose if on daily prednisne

## 2011-12-14 NOTE — Telephone Encounter (Signed)
Called and spoke with pt. She states that for the past wk has developed increased cough- started out dry, but for the past 2 days has been able to produce minimal to moderate amount of clear to white sputum. She states that when she coughs, she also wheezes and has noticed slight increase in DOE as well. No CP, f/c/s or other co's.  She was last seen in May and was advised to f/u PRN. I offered ov with TP or MW for tomorrow and she states has no transportation to make it to the office. Would like something called in. Will forward to doc of the day. Please advise thanks! Allergies  Allergen Reactions  . Aspirin   . Hydrocodone-Acetaminophen   . Naproxen   . Olopatadine   . Sulfonamide Derivatives

## 2012-02-10 ENCOUNTER — Other Ambulatory Visit: Payer: Self-pay | Admitting: Internal Medicine

## 2012-02-10 MED ORDER — CLOTRIMAZOLE 10 MG MT TROC: 10.0000 mg | Freq: Four times a day (QID) | OROMUCOSAL | Status: AC

## 2012-02-10 MED ORDER — PREDNISONE 10 MG PO TABS: 10.0000 mg | ORAL_TABLET | ORAL | Status: DC

## 2012-02-10 NOTE — Telephone Encounter (Signed)
Pharmacy requesting  mycelex 10 mg <> dissolve 1 tablet in mouth four times a day as needed for thursh. Last fill 09-15-11  Prednisone 10 mg Take as directed. #100  Last fill 10-13-11  Dr wert Please Francee Piccolo Thank you

## 2012-02-10 NOTE — Telephone Encounter (Signed)
Rxs have been sent in. 

## 2012-02-10 NOTE — Telephone Encounter (Signed)
F/u here is prn so ok to refill x one but at this point needs to either come in for ov before refills or let Dr Jeannetta Nap handle all refills.

## 2012-03-08 ENCOUNTER — Other Ambulatory Visit: Payer: Self-pay | Admitting: Internal Medicine

## 2012-04-11 ENCOUNTER — Other Ambulatory Visit: Payer: Self-pay | Admitting: Internal Medicine

## 2012-06-07 ENCOUNTER — Other Ambulatory Visit: Payer: Self-pay | Admitting: Internal Medicine

## 2012-07-01 ENCOUNTER — Telehealth: Payer: Self-pay | Admitting: Internal Medicine

## 2012-07-01 DIAGNOSIS — J449 Chronic obstructive pulmonary disease, unspecified: Secondary | ICD-10-CM

## 2012-07-01 NOTE — Telephone Encounter (Signed)
Called and spoke with pt and she stated that her last refill of medications that she picked up stated that she will need an ov for further refills.  Pt stated that it is very hard for her to get out of the house and she can hardly get out of the house for anything.  Pt stated that MW told her to think about pallative care, and pt stated that she was ok to have a nurse come in to help her out if MW feels this will help.  Pt stated that she is just not able to come in for appts and wanted to make sure that MW will still be able to fill her medications.  She will have to order these meds again early in June.  Pt is aware that MW is out of the office until Monday.

## 2012-07-02 NOTE — Telephone Encounter (Signed)
I'm ok with hospice referral and assuming her care as I recommended this on her last visit a year ago and once they do take over they can do her refills too.  Until they do, ok to refill meds

## 2012-07-04 ENCOUNTER — Telehealth: Payer: Self-pay | Admitting: Internal Medicine

## 2012-07-04 NOTE — Telephone Encounter (Signed)
i advised Darlene [name] yes. Nothing further was needed

## 2012-07-04 NOTE — Telephone Encounter (Signed)
Spoke with patient, patient is aware order for hospice has been placed and per Dr. Sherene Sires medications will be filled for her.] Nothing further needed at this time

## 2012-07-05 ENCOUNTER — Telehealth: Payer: Self-pay | Admitting: Internal Medicine

## 2012-07-05 NOTE — Telephone Encounter (Signed)
I gave VO for DNR to [facility]. Nothing further was needed

## 2012-07-05 NOTE — Telephone Encounter (Signed)
Ok for DNR status

## 2012-07-05 NOTE — Telephone Encounter (Signed)
Dr. Sherene Sires please advise if ok to give VO thanks

## 2012-07-07 ENCOUNTER — Other Ambulatory Visit: Payer: Self-pay | Admitting: Internal Medicine

## 2012-08-11 ENCOUNTER — Other Ambulatory Visit: Payer: Self-pay | Admitting: Internal Medicine

## 2012-08-24 ENCOUNTER — Telehealth: Payer: Self-pay | Admitting: Internal Medicine

## 2012-08-24 NOTE — Telephone Encounter (Signed)
Wil forward to MW as Lorain Childes

## 2012-08-26 ENCOUNTER — Telehealth: Payer: Self-pay | Admitting: Internal Medicine

## 2012-08-26 NOTE — Telephone Encounter (Signed)
Error.Darlene [name] ° °

## 2012-08-26 NOTE — Telephone Encounter (Signed)
Called, spoke with Toniann Fail with hospice. Toniann Fail states if pt takes 10 mg she has one problem and experiences other problems on 20 mgs.  Per Toniann Fail, pt reports MW advised pt that she can titrate prednisone from 12.5 mg to 17.5 mg because of this.  Toniann Fail states the Hospice MD has already written orders for this prednisone dosage.  They would like to ensure it is ok with MW AND if so, it will need to be put in epic exactly as he has given her the ok to take because if pt goes to the hospital for any reason, confusion may arise if not correct on med list and given pt's sensitivity to meds.  Dr. Sherene Sires, pls Francee Piccolo.  Thank you.

## 2012-08-26 NOTE — Telephone Encounter (Signed)
I called and made Toniann Fail aware. I have fixed this in EPIC. Nothing further was needed

## 2012-08-26 NOTE — Telephone Encounter (Signed)
Fine to enter it the way she takes it.

## 2012-08-26 NOTE — Telephone Encounter (Signed)
Toniann Fail from hospice called in ref to previous msg says the medical director says prednisone range needs to be put in epic, the range is 12.5 tp 17.5mg  she can be reached at [phone].Raylene Everts

## 2012-09-07 ENCOUNTER — Other Ambulatory Visit: Payer: Self-pay | Admitting: Internal Medicine

## 2012-11-09 ENCOUNTER — Other Ambulatory Visit: Payer: Self-pay | Admitting: Internal Medicine

## 2012-11-09 MED ORDER — PREDNISONE 10 MG PO TABS: ORAL_TABLET | ORAL | Status: DC

## 2013-01-11 ENCOUNTER — Other Ambulatory Visit: Payer: Self-pay | Admitting: Internal Medicine

## 2013-01-11 ENCOUNTER — Telehealth: Payer: Self-pay | Admitting: Internal Medicine

## 2013-01-11 MED ORDER — PNEUMOCOCCAL VAC POLYVALENT 25 MCG/0.5ML IJ INJ: 0.5000 mL | INJECTION | Freq: Once | INTRAMUSCULAR | Status: DC

## 2013-01-11 NOTE — Telephone Encounter (Signed)
Verbal given to Hospice to administer PNA vaccine.

## 2013-02-10 ENCOUNTER — Telehealth: Payer: Self-pay | Admitting: Internal Medicine

## 2013-02-10 MED ORDER — BUDESONIDE-FORMOTEROL FUMARATE 160-4.5 MCG/ACT IN AERO: 2.0000 | INHALATION_SPRAY | Freq: Two times a day (BID) | RESPIRATORY_TRACT | Status: DC

## 2013-02-10 NOTE — Telephone Encounter (Signed)
Symbicort denied by insurance  Per MW- new symbicort coupon can be mailed to the pt and she can get symbicort for 25 each month  Pt aware  Coupon and new rx was mailed to her

## 2013-03-09 IMAGING — CR DG CHEST 2V
2 series · 2 of 2 positions shown · non-contrast
Comparison: Plain films of the chest 04/17/2008.  CT chest
07/13/2007.

CLINICAL DATA: Shortness of breath.  COPD.

CHEST - 2 VIEW

[view not recorded (1 of 2)]
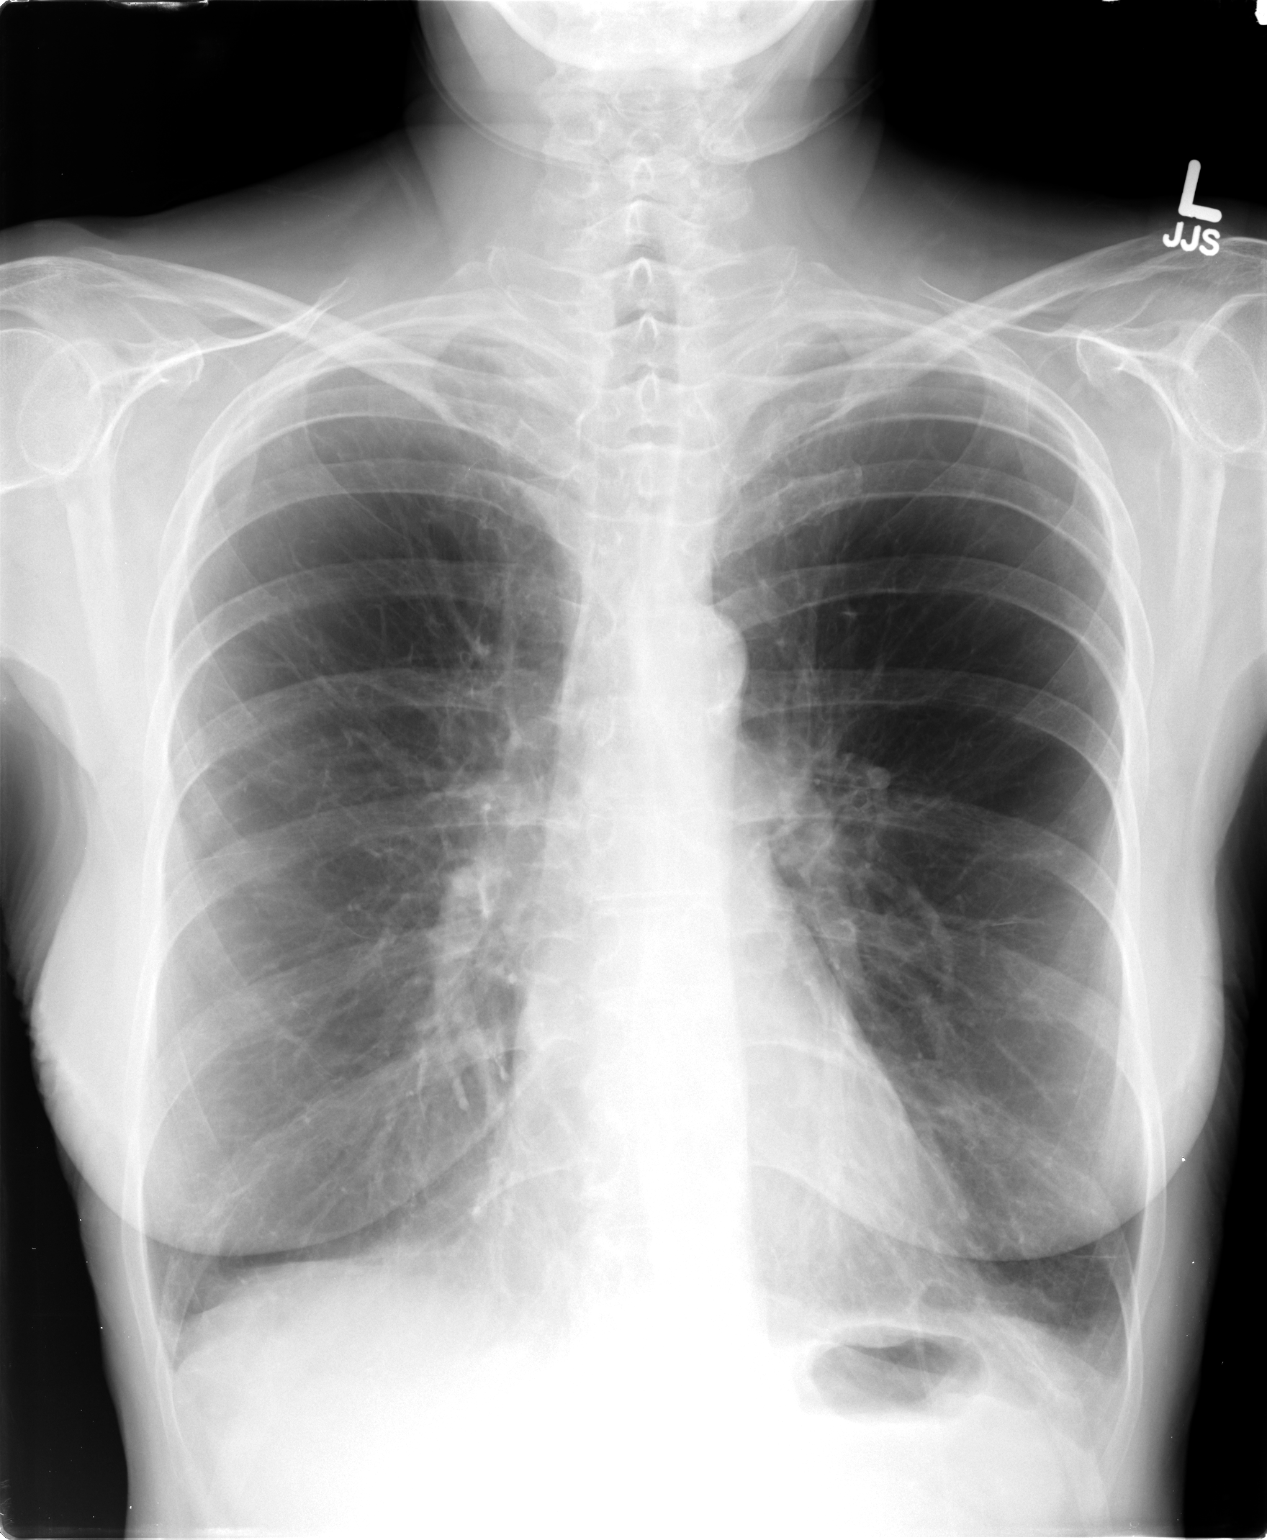

[view not recorded (2 of 2)]
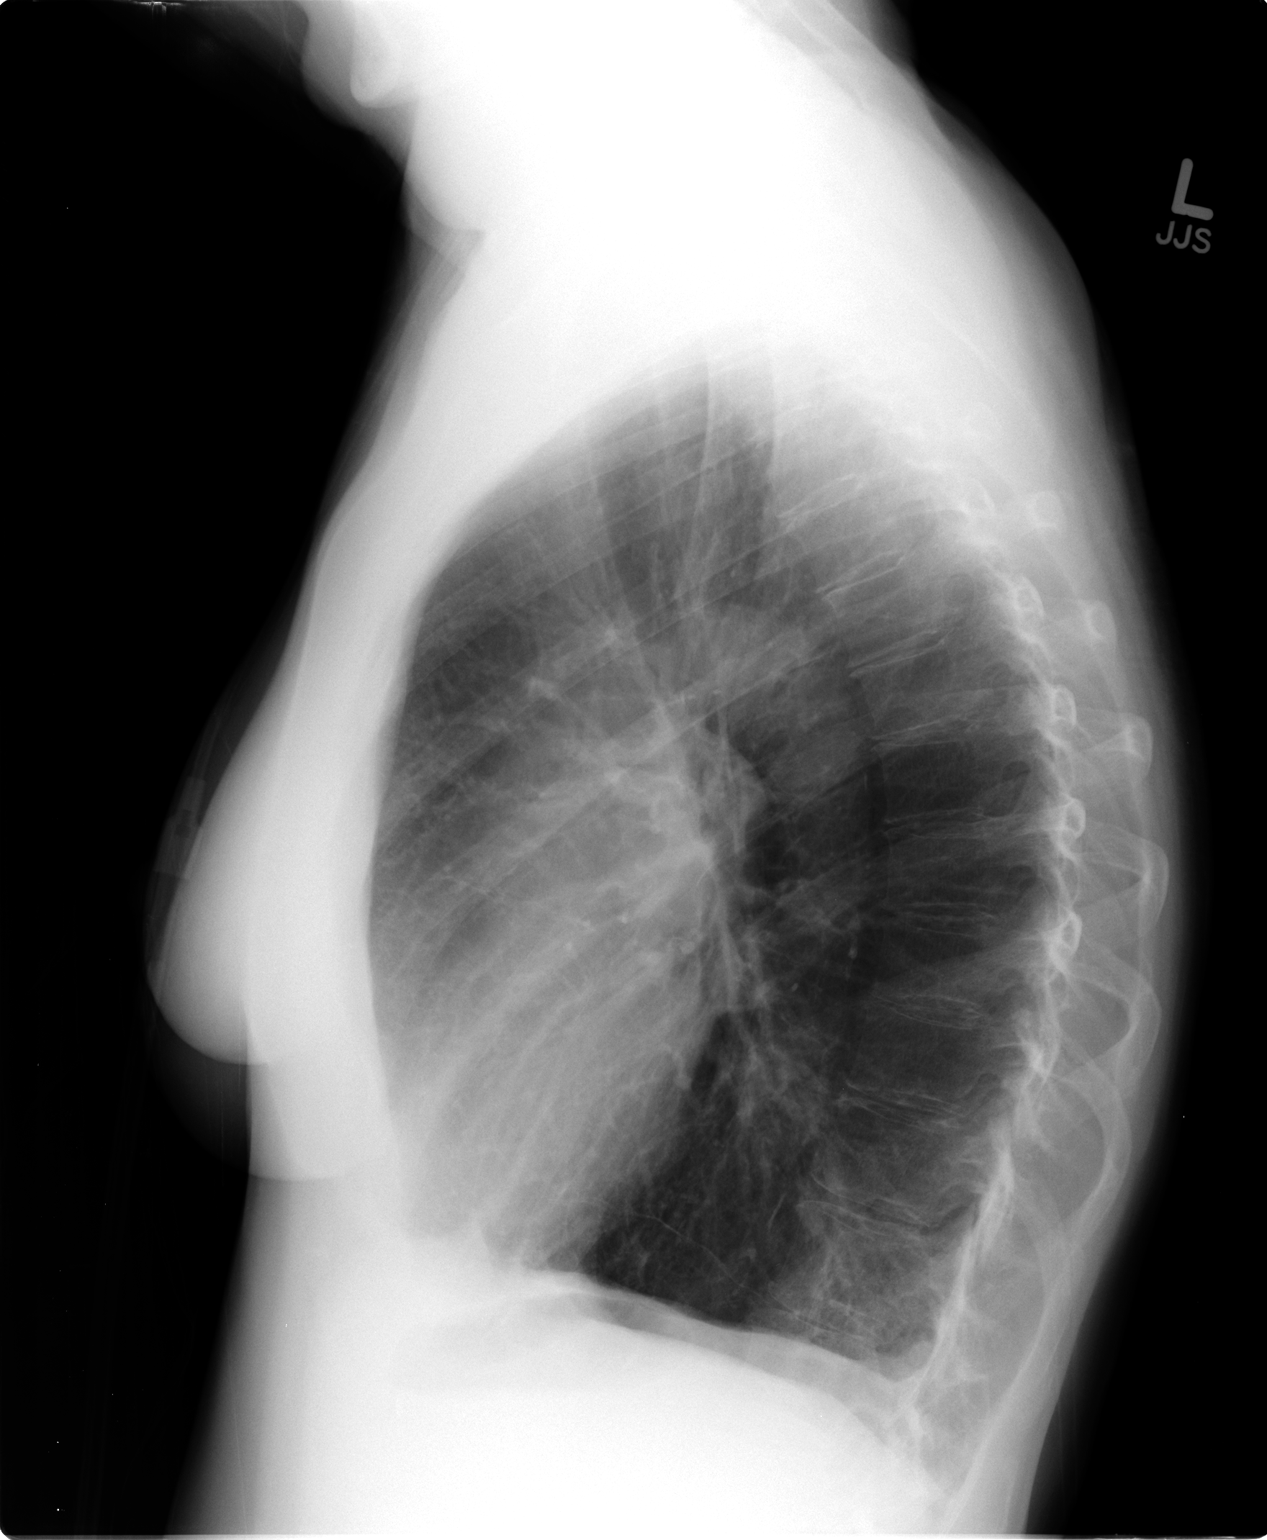

[2 of 2 positions shown; findings below may reference images not displayed]

FINDINGS: The lungs appear severely emphysematous but are clear.
No pneumothorax or pleural fluid.  Heart size is normal.
IMPRESSION: Marked emphysema without acute disease.

## 2013-09-26 ENCOUNTER — Encounter (HOSPITAL_COMMUNITY): Payer: Self-pay | Admitting: Emergency Medicine

## 2013-09-26 ENCOUNTER — Inpatient Hospital Stay (HOSPITAL_COMMUNITY)
Admission: EM | Admit: 2013-09-26 | Discharge: 2013-09-27 | DRG: 190 | Disposition: A | Discharge status: Hospice | Attending: Internal Medicine | Admitting: Internal Medicine

## 2013-09-26 ENCOUNTER — Telehealth: Payer: Self-pay | Admitting: Internal Medicine

## 2013-09-26 ENCOUNTER — Emergency Department (HOSPITAL_COMMUNITY)

## 2013-09-26 DIAGNOSIS — Z66 Do not resuscitate: Secondary | ICD-10-CM | POA: Diagnosis present

## 2013-09-26 DIAGNOSIS — Z515 Encounter for palliative care: Secondary | ICD-10-CM | POA: Diagnosis not present

## 2013-09-26 DIAGNOSIS — F411 Generalized anxiety disorder: Secondary | ICD-10-CM | POA: Diagnosis present

## 2013-09-26 DIAGNOSIS — E119 Type 2 diabetes mellitus without complications: Secondary | ICD-10-CM | POA: Diagnosis present

## 2013-09-26 DIAGNOSIS — J441 Chronic obstructive pulmonary disease with (acute) exacerbation: Secondary | ICD-10-CM | POA: Diagnosis present

## 2013-09-26 DIAGNOSIS — R739 Hyperglycemia, unspecified: Secondary | ICD-10-CM

## 2013-09-26 DIAGNOSIS — I498 Other specified cardiac arrhythmias: Secondary | ICD-10-CM | POA: Diagnosis present

## 2013-09-26 DIAGNOSIS — E43 Unspecified severe protein-calorie malnutrition: Secondary | ICD-10-CM | POA: Diagnosis present

## 2013-09-26 DIAGNOSIS — J9621 Acute and chronic respiratory failure with hypoxia: Secondary | ICD-10-CM

## 2013-09-26 DIAGNOSIS — D75839 Thrombocytosis, unspecified: Secondary | ICD-10-CM | POA: Diagnosis present

## 2013-09-26 DIAGNOSIS — Z9981 Dependence on supplemental oxygen: Secondary | ICD-10-CM

## 2013-09-26 DIAGNOSIS — E860 Dehydration: Secondary | ICD-10-CM | POA: Diagnosis present

## 2013-09-26 DIAGNOSIS — D72829 Elevated white blood cell count, unspecified: Secondary | ICD-10-CM | POA: Diagnosis present

## 2013-09-26 DIAGNOSIS — Z87891 Personal history of nicotine dependence: Secondary | ICD-10-CM | POA: Diagnosis not present

## 2013-09-26 DIAGNOSIS — I471 Supraventricular tachycardia: Secondary | ICD-10-CM | POA: Diagnosis present

## 2013-09-26 DIAGNOSIS — J9620 Acute and chronic respiratory failure, unspecified whether with hypoxia or hypercapnia: Secondary | ICD-10-CM | POA: Diagnosis present

## 2013-09-26 DIAGNOSIS — R7309 Other abnormal glucose: Secondary | ICD-10-CM

## 2013-09-26 DIAGNOSIS — IMO0002 Reserved for concepts with insufficient information to code with codable children: Secondary | ICD-10-CM | POA: Diagnosis not present

## 2013-09-26 DIAGNOSIS — F419 Anxiety disorder, unspecified: Secondary | ICD-10-CM | POA: Diagnosis present

## 2013-09-26 DIAGNOSIS — D473 Essential (hemorrhagic) thrombocythemia: Secondary | ICD-10-CM | POA: Diagnosis present

## 2013-09-26 DIAGNOSIS — R0902 Hypoxemia: Secondary | ICD-10-CM

## 2013-09-26 HISTORY — DX: Anxiety disorder, unspecified: F41.9

## 2013-09-26 HISTORY — DX: Chronic respiratory failure, unspecified whether with hypoxia or hypercapnia: J96.10

## 2013-09-26 HISTORY — DX: Type 2 diabetes mellitus without complications: E11.9

## 2013-09-26 LAB — COMPREHENSIVE METABOLIC PANEL
ALBUMIN: 2.4 g/dL — AB (ref 3.5–5.2)
ALK PHOS: 137 U/L — AB (ref 39–117)
ALT: 10 U/L (ref 0–35)
AST: 22 U/L (ref 0–37)
Anion gap: 23 — ABNORMAL HIGH (ref 5–15)
BILIRUBIN TOTAL: 0.6 mg/dL (ref 0.3–1.2)
BUN: 15 mg/dL (ref 6–23)
CHLORIDE: 92 meq/L — AB (ref 96–112)
CO2: 21 meq/L (ref 19–32)
Calcium: 10.5 mg/dL (ref 8.4–10.5)
Creatinine, Ser: 0.45 mg/dL — ABNORMAL LOW (ref 0.50–1.10)
GFR calc Af Amer: >90 mL/min (ref >90)
Glucose, Bld: 172 mg/dL — ABNORMAL HIGH (ref 70–99)
POTASSIUM: 4.1 meq/L (ref 3.7–5.3)
SODIUM: 136 meq/L — AB (ref 137–147)
Total Protein: 7.9 g/dL (ref 6.0–8.3)

## 2013-09-26 LAB — BLOOD GAS, VENOUS
ACID-BASE DEFICIT: 6.4 mmol/L — AB (ref 0.0–2.0)
Bicarbonate: 24.3 mEq/L — ABNORMAL HIGH (ref 20.0–24.0)
DELIVERY SYSTEMS: POSITIVE
FIO2: 0.5 %
O2 Saturation: 66 %
PEEP/CPAP: 6 cmH2O
PH VEN: 7.133 — AB (ref 7.250–7.300)
PIP: 14 cmH2O
Patient temperature: 98.6
TCO2: 23.4 mmol/L (ref 0–100)
pCO2, Ven: 75.6 mmHg (ref 45.0–50.0)
pO2, Ven: 47.6 mmHg — ABNORMAL HIGH (ref 30.0–45.0)

## 2013-09-26 LAB — CBC WITH DIFFERENTIAL/PLATELET
BASOS ABS: 0 10*3/uL (ref 0.0–0.1)
Basophils Relative: 0 % (ref 0–1)
Eosinophils Absolute: 0 10*3/uL (ref 0.0–0.7)
Eosinophils Relative: 0 % (ref 0–5)
HEMATOCRIT: 42.7 % (ref 36.0–46.0)
Hemoglobin: 13.8 g/dL (ref 12.0–15.0)
Lymphocytes Relative: 9 % — ABNORMAL LOW (ref 12–46)
Lymphs Abs: 2.9 10*3/uL (ref 0.7–4.0)
MCH: 29.4 pg (ref 26.0–34.0)
MCHC: 32.3 g/dL (ref 30.0–36.0)
MCV: 91 fL (ref 78.0–100.0)
MONO ABS: 2.6 10*3/uL — AB (ref 0.1–1.0)
MONOS PCT: 8 % (ref 3–12)
NEUTROS ABS: 26.6 10*3/uL — AB (ref 1.7–7.7)
Neutrophils Relative %: 83 % — ABNORMAL HIGH (ref 43–77)
PLATELETS: 609 10*3/uL — AB (ref 150–400)
RBC: 4.69 MIL/uL (ref 3.87–5.11)
RDW: 13.9 % (ref 11.5–15.5)
WBC: 32.1 10*3/uL — ABNORMAL HIGH (ref 4.0–10.5)

## 2013-09-26 LAB — I-STAT CG4 LACTIC ACID, ED: Lactic Acid, Venous: 2.97 mmol/L — ABNORMAL HIGH (ref 0.5–2.2)

## 2013-09-26 MED ORDER — LEVALBUTEROL HCL 0.63 MG/3ML IN NEBU: 0.6300 mg | INHALATION_SOLUTION | Freq: Four times a day (QID) | RESPIRATORY_TRACT | Status: DC | PRN

## 2013-09-26 MED ORDER — SODIUM CHLORIDE 0.9 % IV SOLN
Freq: Once | INTRAVENOUS | Status: AC
  Administered 2013-09-26: 18:00:00 via INTRAVENOUS

## 2013-09-26 MED ORDER — ENOXAPARIN SODIUM 40 MG/0.4ML ~~LOC~~ SOLN
40.0000 mg | SUBCUTANEOUS | Status: DC
  Administered 2013-09-26: 40 mg via SUBCUTANEOUS
  Filled 2013-09-26 (×2): qty 0.4

## 2013-09-26 MED ORDER — BUDESONIDE-FORMOTEROL FUMARATE 160-4.5 MCG/ACT IN AERO
2.0000 | INHALATION_SPRAY | Freq: Two times a day (BID) | RESPIRATORY_TRACT | Status: DC
  Filled 2013-09-26: qty 6

## 2013-09-26 MED ORDER — ACETAMINOPHEN 650 MG RE SUPP: 650.0000 mg | Freq: Four times a day (QID) | RECTAL | Status: DC | PRN

## 2013-09-26 MED ORDER — CLOTRIMAZOLE 10 MG MT TROC
10.0000 mg | Freq: Four times a day (QID) | OROMUCOSAL | Status: DC
  Administered 2013-09-27: 10 mg via ORAL
  Filled 2013-09-26 (×5): qty 1

## 2013-09-26 MED ORDER — PAROXETINE HCL 10 MG PO TABS
5.0000 mg | ORAL_TABLET | Freq: Every day | ORAL | Status: DC
  Administered 2013-09-27: 5 mg via ORAL
  Filled 2013-09-26: qty 0.5

## 2013-09-26 MED ORDER — PANTOPRAZOLE SODIUM 40 MG PO TBEC
40.0000 mg | DELAYED_RELEASE_TABLET | Freq: Every day | ORAL | Status: DC
  Administered 2013-09-27: 40 mg via ORAL
  Filled 2013-09-26: qty 1

## 2013-09-26 MED ORDER — CHLORHEXIDINE GLUCONATE 0.12 % MT SOLN
15.0000 mL | Freq: Two times a day (BID) | OROMUCOSAL | Status: DC
  Filled 2013-09-26 (×4): qty 15

## 2013-09-26 MED ORDER — ONDANSETRON HCL 4 MG PO TABS: 4.0000 mg | ORAL_TABLET | Freq: Four times a day (QID) | ORAL | Status: DC | PRN

## 2013-09-26 MED ORDER — LORAZEPAM 2 MG/ML IJ SOLN
1.0000 mg | Freq: Once | INTRAMUSCULAR | Status: AC
  Administered 2013-09-26: 1 mg via INTRAVENOUS
  Filled 2013-09-26: qty 1

## 2013-09-26 MED ORDER — POLYETHYLENE GLYCOL 3350 17 G PO PACK
17.0000 g | PACK | Freq: Every day | ORAL | Status: DC | PRN
  Filled 2013-09-26: qty 1

## 2013-09-26 MED ORDER — GLIMEPIRIDE 2 MG PO TABS
2.0000 mg | ORAL_TABLET | Freq: Every day | ORAL | Status: DC
  Administered 2013-09-27: 2 mg via ORAL
  Filled 2013-09-26 (×2): qty 1

## 2013-09-26 MED ORDER — ACETAMINOPHEN 325 MG PO TABS: 650.0000 mg | ORAL_TABLET | Freq: Four times a day (QID) | ORAL | Status: DC | PRN

## 2013-09-26 MED ORDER — SODIUM CHLORIDE 0.9 % IV SOLN
INTRAVENOUS | Status: DC
  Administered 2013-09-27: 01:00:00 via INTRAVENOUS

## 2013-09-26 MED ORDER — MORPHINE SULFATE 4 MG/ML IJ SOLN
4.0000 mg | INTRAMUSCULAR | Status: DC | PRN
  Administered 2013-09-26 – 2013-09-27 (×2): 4 mg via INTRAVENOUS
  Filled 2013-09-26 (×2): qty 1

## 2013-09-26 MED ORDER — ONDANSETRON HCL 4 MG/2ML IJ SOLN
4.0000 mg | Freq: Four times a day (QID) | INTRAMUSCULAR | Status: DC | PRN
Start: 2013-09-26 — End: 2013-09-27

## 2013-09-26 MED ORDER — ALUM & MAG HYDROXIDE-SIMETH 200-200-20 MG/5ML PO SUSP: 30.0000 mL | Freq: Four times a day (QID) | ORAL | Status: DC | PRN

## 2013-09-26 MED ORDER — CETYLPYRIDINIUM CHLORIDE 0.05 % MT LIQD: 7.0000 mL | Freq: Two times a day (BID) | OROMUCOSAL | Status: DC

## 2013-09-26 MED ORDER — METHYLPREDNISOLONE SODIUM SUCC 125 MG IJ SOLR
80.0000 mg | Freq: Four times a day (QID) | INTRAMUSCULAR | Status: DC
  Administered 2013-09-26 – 2013-09-27 (×3): 80 mg via INTRAVENOUS
  Filled 2013-09-26 (×7): qty 1.28

## 2013-09-26 MED ORDER — IPRATROPIUM-ALBUTEROL 0.5-2.5 (3) MG/3ML IN SOLN
3.0000 mL | Freq: Once | RESPIRATORY_TRACT | Status: AC
  Administered 2013-09-26: 3 mL via RESPIRATORY_TRACT
  Filled 2013-09-26: qty 3

## 2013-09-26 MED ORDER — GUAIFENESIN-DM 100-10 MG/5ML PO SYRP: 5.0000 mL | ORAL_SOLUTION | ORAL | Status: DC | PRN

## 2013-09-26 MED ORDER — PAROXETINE HCL 10 MG PO TABS
10.0000 mg | ORAL_TABLET | Freq: Every day | ORAL | Status: DC
  Filled 2013-09-26 (×2): qty 1

## 2013-09-26 MED ORDER — LEVALBUTEROL HCL 0.63 MG/3ML IN NEBU
0.6300 mg | INHALATION_SOLUTION | Freq: Four times a day (QID) | RESPIRATORY_TRACT | Status: DC
  Administered 2013-09-26 – 2013-09-27 (×3): 0.63 mg via RESPIRATORY_TRACT
  Filled 2013-09-26 (×7): qty 3

## 2013-09-26 MED ORDER — LORAZEPAM 2 MG/ML IJ SOLN
1.0000 mg | INTRAMUSCULAR | Status: DC | PRN
  Administered 2013-09-27: 1 mg via INTRAVENOUS
  Filled 2013-09-26: qty 1

## 2013-09-26 MED ORDER — TIOTROPIUM BROMIDE MONOHYDRATE 18 MCG IN CAPS
18.0000 ug | ORAL_CAPSULE | Freq: Every day | RESPIRATORY_TRACT | Status: DC
  Filled 2013-09-26: qty 5

## 2013-09-26 NOTE — ED Notes (Addendum)
pts cousin name is Roshan as well Home [phone] Cell [phone]  Maudry Diego reports pt wants [facility] on [address]

## 2013-09-26 NOTE — H&P (Signed)
History and Physical:    Darlene [name] QAS:[id number] DOB: 1940/04/05 DOA: 09/26/2013  Referring physician: Dr. Colin Rhein PCP: Leonard Downing, MD   Chief Complaint: AMS, dyspnea.  History of Present Illness:   Darlene [name] is an 73 y.o. female with end-stage COPD (GOLD IV) and chronic respiratory failure (home oxygen and steroid dependent since 07/2007), under hospice care, lives alone and whose cousin checks on her periodically, was noted to be dyspneic and with altered mental status when her cousin checked in on her today. The patient's cousin called her hospice nurse who went to her home to evaluate her and found her with extremely labored breathing, weak with inability to take her medications or take in any nutrition for the past 3 days. Patient requested admission to [facility], but no beds were available and so she was brought here for symptom management until a bed is available at [facility].  Patient is currently on CPAP and endorses dyspnea that has steadily worsened over the past few days. She is unkempt. She has a DO NOT RESUSCITATE form at the bedside. According to the patient's cousin, her clinical condition has deteriorated rapidly over the past week.  ROS:   Unable to obtain as the patient is in extreme respiratory distress with CPAP machine on.  Past Medical History:   Past Medical History  Diagnosis Date  . COPD (chronic obstructive pulmonary disease)     Steroid dependent since 2009  . Chronic respiratory failure     Oxygen dependent since 2009  . Diabetes mellitus without complication   . Anxiety disorder     Past Surgical History:   Past Surgical History  Procedure Laterality Date  . Abdominal hysterectomy  1983  . Appendectomy  1960    Social History:   History   Social History  . Marital Status: Divorced    Spouse Name: N/A    Number of Children: 0  . Years of Education: N/A   Occupational History  . Retired Radiation protection practitioner    Social  History Main Topics  . Smoking status: Former Smoker    Quit date: 01/03/1995  . Smokeless tobacco: Never Used  . Alcohol Use: No  . Drug Use: No  . Sexual Activity: Not on file   Other Topics Concern  . Not on file   Social History Narrative   Lives alone. Divorced. Retired Radiation protection practitioner. Has a cousin who checks in on her but otherwise no family.    Family history:   Family History  Problem Relation Age of Onset  . Emphysema Maternal Uncle     smoked  . Diabetes type II Mother   . Stroke Mother     Allergies   Aspirin; Hydrocodone-acetaminophen; Naproxen; Olopatadine; and Sulfonamide derivatives  Current Medications:   Prior to Admission medications   Medication Sig Start Date End Date Taking? Authorizing Provider  budesonide-formoterol (SYMBICORT) 160-4.5 MCG/ACT inhaler Inhale 2 puffs into the lungs 2 (two) times daily. 02/10/13  Yes Tanda Rockers, MD  Calcium Carbonate-Vitamin D (CALCIUM + D PO) Take by mouth daily.     Yes Historical Provider, MD  clotrimazole (MYCELEX) 10 MG troche Take 1 tablet (10 mg total) by mouth 4 (four) times daily. 02/10/12  Yes Tanda Rockers, MD  glimepiride (AMARYL) 4 MG tablet 1/2 tablet daily 04/21/11   Historical Provider, MD  metFORMIN (GLUCOPHAGE) 500 MG tablet Take 1,000 mg by mouth 2 (two) times daily with a meal.     Historical Provider,  MD  pantoprazole (PROTONIX) 40 MG tablet Take by mouth daily.      Historical Provider, MD  PARoxetine (PAXIL) 10 MG tablet 1/2 tab by mouth once daily     Historical Provider, MD  predniSONE (DELTASONE) 10 MG tablet Use as directed 11/09/12   Tanda Rockers, MD  traMADol (ULTRAM) 50 MG tablet Take by mouth. 1/4 tablet as needed for pain    Historical Provider, MD    Physical Exam:   Filed Vitals:   09/26/13 1630 09/26/13 1720 09/26/13 1730 09/26/13 1738  BP: 131/66  126/76   Pulse:  154 150 148  Temp:      TempSrc:      Resp: 32 35 23 33  SpO2:  96% 97%      Physical Exam: Blood pressure  126/76, pulse 148, temperature 97.4 F (36.3 C), temperature source Axillary, resp. rate 33, SpO2 97.00%. Gen: Chronically ill-appearing cachectic female in moderate respiratory distress. Head: Normocephalic, atraumatic. Eyes: PERRL, EOMI, sclerae nonicteric. Mouth: Oropharynx with poor dentition and oral hygiene. Neck: Supple, no thyromegaly, no lymphadenopathy, no jugular venous distention. Chest: Lungs with decreased breath sounds and expiratory wheezes. CV: Heart sounds are tachycardic, regular. No murmurs, rubs, or gallops. Abdomen: Soft, nontender, nondistended with normal active bowel sounds. Extremities: Extremities are without clubbing, edema, and cyanosis. Skin: Warm and dry. Neuro: Alert oriented to self and place; cranial nerves II through XII grossly intact. Psych: Mood and affect anxious.   Data Review:    Labs: Basic Metabolic Panel:  Recent Labs Lab 09/26/13 1541  NA 136*  K 4.1  CL 92*  CO2 21  GLUCOSE 172*  BUN 15  CREATININE 0.45*  CALCIUM 10.5   Liver Function Tests:  Recent Labs Lab 09/26/13 1541  AST 22  ALT 10  ALKPHOS 137*  BILITOT 0.6  PROT 7.9  ALBUMIN 2.4*   CBC:  Recent Labs Lab 09/26/13 1541  WBC 32.1*  NEUTROABS 26.6*  HGB 13.8  HCT 42.7  MCV 91.0  PLT 609*    Radiographic Studies: Dg Chest Portable 1 View  09/26/2013   CLINICAL DATA:  Respiratory distress and emphysema  EXAM: PORTABLE CHEST - 1 VIEW  COMPARISON:  Jun 17, 2011  FINDINGS: There is underlying emphysema. There is widespread reticulonodular interstitial disease throughout the mid and lower lung zones. There is consolidation in the right mid lung.  Heart size is normal. Pulmonary vascularity reflects underlying emphysema. No adenopathy. No bone lesions.  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 This reticulonodular interstitial disease was not present previously. 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 Atypical presentation of congestive heart failure in atypical pneumonia are differential considerations. There is focal airspace consolidation in the right mid lung, most likely due to bacterial pneumonia. No change in cardiac silhouette.   Electronically Signed   By: Lowella Grip M.D.   On: 09/26/2013 16:04    EKG: Independently reviewed. SVT at 154 bpm.   Assessment/Plan:   Principal Problem:   Acute on chronic respiratory failure secondary to COPD exacerbation  Will place on a nonrebreather mask and wean CPAP.  COPD exacerbation likely secondary to inability to take maintenance dose prednisone. We'll place on Solu-Medrol 80 mg IV every 6 hours for now. Wean as tolerated.  No antibiotics. Keep comfortable. Xopenex every 6 hours and as needed. Continue Spiriva. Continue Symbicort.  Prognosis poor. Transfer to residential hospice facility when bed available.  Active Problems:   Diabetes/Hyperglycemia  Given  goals of care, would not check CBGs and provide insulin coverage.  Discontinue metformin.  Okay to take Amaryl when able to take by mouth medications.    Leukocytosis  No antibiotics. No obvious pneumonia on chest radiography. Comfort care measures.    Thrombocytosis  Likely reflective of dehydration or infection. No antibiotics. Gently hydrate.    Severe protein-calorie malnutrition  Dietitian consultation requested.  Liberalized diet.    Anxiety  Ativan 1 mg every 4 hours when necessary ordered.  Continue Paxil as able to tolerate.    SVT (supraventricular tachycardia)  Reflective of acute respiratory distress, hypoxia, and anxiety.  Use Xopenex instead of albuterol to treat bronchospasm.  Use Ativan as needed for anxiolytic therapy.    Dehydration  Gently hydrate.    DVT prophylaxis  Lovenox ordered.  Code Status: DNR Family Communication: Cousin at bedside. Disposition Plan: Residential hospice when bed available.  Time spent: 1  hour.  Melvine Julin [facility] Pager [phone] Cell: [phone]   If 7PM-7AM, please contact night-coverage [email or web address] Password [facility] 09/26/2013, 5:43 PM

## 2013-09-26 NOTE — ED Notes (Signed)
Pt from home.  Pt is a hospice care patient.  Increased shortness of breath. Hospice called for EMS transport,.  Pt was to be taken to [facility].  Transfer not able.

## 2013-09-26 NOTE — ED Notes (Signed)
Pt lactic acid result=2.97, notified MD Colin Rhein

## 2013-09-26 NOTE — ED Notes (Signed)
Bed: RESA Expected date:  Expected time:  Means of arrival:  Comments: ems- hospice pt,

## 2013-09-26 NOTE — ED Notes (Signed)
Pt keeps attempting to pull Bipap off. Bipap retightened.

## 2013-09-26 NOTE — ED Notes (Signed)
All pt belongings given to cousin.

## 2013-09-26 NOTE — ED Notes (Addendum)
Per Dr Rockne Menghini pt to be taken off Bipap and placed on non rebreather. Dr Rama aware of pts blood pressure. Order for NS 125 ml/ hr

## 2013-09-26 NOTE — ED Provider Notes (Signed)
CSN: [id number]     Arrival date & time 09/26/13  1517 History   First MD Initiated Contact with Patient 09/26/13 1522     Chief Complaint  Patient presents with  . Respiratory Distress     (Consider location/radiation/quality/duration/timing/severity/associated sxs/prior Treatment) Patient is a 73 y.o. female presenting with shortness of breath.  Shortness of Breath Severity:  Moderate Onset quality:  Gradual Duration:  3 days Timing:  Constant Progression:  Worsening Chronicity:  Chronic Context: not activity and not URI   Relieved by:  Nothing Worsened by:  Nothing tried Ineffective treatments:  None tried Associated symptoms: cough   Associated symptoms: no abdominal pain and no fever     Past Medical History  Diagnosis Date  . COPD (chronic obstructive pulmonary disease)     Steroid dependent since 2009  . Chronic respiratory failure     Oxygen dependent since 2009  . Diabetes mellitus without complication   . Anxiety disorder    Past Surgical History  Procedure Laterality Date  . Abdominal hysterectomy  1983  . Appendectomy  1960   Family History  Problem Relation Age of Onset  . Emphysema Maternal Uncle     smoked  . Diabetes type II Mother   . Stroke Mother    History  Substance Use Topics  . Smoking status: Former Smoker    Quit date: 01/03/1995  . Smokeless tobacco: Never Used  . Alcohol Use: No   OB History   Grav Para Term Preterm Abortions TAB SAB Ect Mult Living                 Review of Systems  Unable to perform ROS: Mental status change  Constitutional: Negative for fever.  Respiratory: Positive for cough and shortness of breath.   Gastrointestinal: Negative for abdominal pain.      Allergies  Aspirin; Hydrocodone-acetaminophen; Naproxen; Olopatadine; and Sulfonamide derivatives  Home Medications   Prior to Admission medications   Medication Sig Start Date End Date Taking? Authorizing Provider  clotrimazole (MYCELEX) 10 MG  troche Take 1 tablet (10 mg total) by mouth 4 (four) times daily. 02/10/12  Yes Tanda Rockers, MD  pantoprazole (PROTONIX) 40 MG tablet Take by mouth daily.     Yes Historical Provider, MD  PARoxetine (PAXIL) 10 MG tablet Take 5-10 mg by mouth 2 (two) times daily. Take 5 mg every morning and 10 mg every evening.   Yes Historical Provider, MD  alum & mag hydroxide-simeth (MAALOX/MYLANTA) 200-200-20 MG/5ML suspension Take 30 mLs by mouth every 6 (six) hours as needed for indigestion or heartburn (dyspepsia). 09/27/13   Barton Dubois, MD  budesonide (PULMICORT) 0.25 MG/2ML nebulizer solution Take 2 mLs (0.25 mg total) by nebulization 2 (two) times daily. 09/27/13   Barton Dubois, MD  guaiFENesin-dextromethorphan [facility] DM) 100-10 MG/5ML syrup Take 5 mLs by mouth every 4 (four) hours as needed for cough. 09/27/13   Barton Dubois, MD  ipratropium (ATROVENT) 0.02 % nebulizer solution Take 2.5 mLs (0.5 mg total) by nebulization 4 (four) times daily. 09/27/13   Barton Dubois, MD  levalbuterol Penne Lash) 0.63 MG/3ML nebulizer solution Take 3 mLs (0.63 mg total) by nebulization every 6 (six) hours. 09/27/13   Barton Dubois, MD  LORazepam (ATIVAN) 0.5 MG tablet Take 1 tablet (0.5 mg total) by mouth every 3 (three) hours as needed for anxiety. 09/27/13   Barton Dubois, MD  Morphine Sulfate (MORPHINE CONCENTRATE) 10 mg / 0.5 ml concentrated solution Take 0.25 mLs (5 mg total) by  mouth every 3 (three) hours as needed for severe pain or shortness of breath. 09/27/13   Barton Dubois, MD  predniSONE (DELTASONE) 20 MG tablet Take 1 tablet (20 mg total) by mouth daily as needed. 09/27/13   Barton Dubois, MD   BP 132/61  Pulse 127  Temp(Src) 97.6 F (36.4 C) (Axillary)  Resp 22  SpO2 100% Physical Exam  Vitals reviewed. Constitutional: She appears well-developed and well-nourished.  HENT:  Head: Normocephalic and atraumatic.  Right Ear: External ear normal.  Left Ear: External ear normal.  Eyes: Conjunctivae and EOM  are normal. Pupils are equal, round, and reactive to light.  Neck: Normal range of motion. Neck supple.  Cardiovascular: Normal rate, regular rhythm, normal heart sounds and intact distal pulses.   Pulmonary/Chest: Effort normal and breath sounds normal.  Abdominal: Soft. Bowel sounds are normal. There is no tenderness.  Musculoskeletal: Normal range of motion.  Neurological: She is alert. GCS eye subscore is 4. GCS verbal subscore is 1. GCS motor subscore is 6.  Skin: Skin is warm and dry.    ED Course  Procedures (including critical care time) Labs Review Labs Reviewed  COMPREHENSIVE METABOLIC PANEL - Abnormal; Notable for the following:    Sodium 136 (*)    Chloride 92 (*)    Glucose, Bld 172 (*)    Creatinine, Ser 0.45 (*)    Albumin 2.4 (*)    Alkaline Phosphatase 137 (*)    Anion gap 23 (*)    All other components within normal limits  CBC WITH DIFFERENTIAL - Abnormal; Notable for the following:    WBC 32.1 (*)    Platelets 609 (*)    Neutrophils Relative % 83 (*)    Lymphocytes Relative 9 (*)    Neutro Abs 26.6 (*)    Monocytes Absolute 2.6 (*)    All other components within normal limits  BLOOD GAS, VENOUS - Abnormal; Notable for the following:    pH, Ven 7.133 (*)    pCO2, Ven 75.6 (*)    pO2, Ven 47.6 (*)    Bicarbonate 24.3 (*)    Acid-base deficit 6.4 (*)    All other components within normal limits  I-STAT CG4 LACTIC ACID, ED - Abnormal; Notable for the following:    Lactic Acid, Venous 2.97 (*)    All other components within normal limits    Imaging Review No results found.   EKG Interpretation   Date/Time:  Tuesday September 26 2013 15:27:15 EDT Ventricular Rate:  154 PR Interval:  117 QRS Duration: 62 QT Interval:  240 QTC Calculation: 384 R Axis:   73 Text Interpretation:  Supraventricular tachycardia Probable anteroseptal  infarct, old Repolarization abnormality, prob rate related Baseline wander  in lead(s) V6 ED PHYSICIAN INTERPRETATION  AVAILABLE IN [facility]  Confirmed by TEST, Record ([id number]) on 09/28/2013 11:45:46 AM      MDM   Final diagnoses:  COPD exacerbation  Acute on chronic respiratory failure with hypoxia  Anxiety  Dehydration  Type 2 diabetes mellitus without complication  Hyperglycemia  Leukocytosis  Severe protein-calorie malnutrition  SVT (supraventricular tachycardia)    73 y.o. female  with pertinent PMH of end stage COPD presents with dyspnea and decreased mental status.  Pt was to be admitted to hospice facillity from home, but there were no beds, so she was transferred here.  Apparently, she has had 3 days acute worsening and with family and PCP discussion, decision was made to admit to hospice for final days  of life care.  On arrival pt with vitals and physical exam as above.  She was acutely ill and placed on bipap for the same.  I spoke with her cousin and the hospice nurse, who agree that pt would want nothing further done, and a DNR form was at bedside.  Admitted to hospitalist in stable condition with some improvement after Bipap.    Labs and imaging as above reviewed.   1. COPD exacerbation   2. Acute on chronic respiratory failure with hypoxia   3. Anxiety   4. Dehydration   5. Type 2 diabetes mellitus without complication   6. Hyperglycemia   7. Leukocytosis   8. Severe protein-calorie malnutrition   9. SVT (supraventricular tachycardia)         Debby Freiberg, MD 09/28/13 [phone]

## 2013-09-26 NOTE — Telephone Encounter (Signed)
I spoke with Varney Biles, hospice nurse. She is at the pt home now and she states the pt is having a difficult time breathing. She states her breathing is labored, she has been in her recliner for 3 days. She states she is very weak, unable to hold a cup. She states the hospice docs want to admit her to [facility] but there are no beds so they are asking if we can direct admit her to palliative care unit at [facility]? She states the pt lives alone and does not have any family. She does not feel like the pt can stay home at this point.  Please advise. Tru[place] Bing, CMA

## 2013-09-26 NOTE — ED Notes (Signed)
Pt DNR from facility.  DNR paperwork at bedside.

## 2013-09-26 NOTE — ED Notes (Signed)
hospitalist at bedside

## 2013-09-26 NOTE — Telephone Encounter (Signed)
Varney Biles advised to take pt to [facility] ED for evaluation. [place] Bing, CMA

## 2013-09-26 NOTE — ED Notes (Addendum)
Pt is hospice for COPD. Pt has no family, has 1 counsin. Pt talked to cousin today, cousin called hospice because pt sounded bad. Hospice nurse came out to visit. Pt progressive worse than last visit last week. Pt wanted to go to [facility], there are no beds. Goal is to admit pt for pallitive care and then transfer to [facility] once bed becomes available. Pt unable to go home because she lives by alone and is by herself 24 hours. Hospice nurse estimated 2 weeks.   ems 20 g L AC, 10 albuterol, 0.5 atrovent, 125 solumedrol given by ems.   Hospice nurse Nichola Sizer  pts cousin name is Maripat [facility] [phone] Cell [phone]

## 2013-09-27 DIAGNOSIS — I498 Other specified cardiac arrhythmias: Secondary | ICD-10-CM

## 2013-09-27 MED ORDER — MORPHINE SULFATE (CONCENTRATE) 10 MG /0.5 ML PO SOLN: 5.0000 mg | ORAL | Status: AC | PRN

## 2013-09-27 MED ORDER — LEVALBUTEROL HCL 0.63 MG/3ML IN NEBU: 0.6300 mg | INHALATION_SOLUTION | Freq: Four times a day (QID) | RESPIRATORY_TRACT | Status: AC

## 2013-09-27 MED ORDER — IPRATROPIUM BROMIDE 0.02 % IN SOLN: 0.5000 mg | Freq: Four times a day (QID) | RESPIRATORY_TRACT | Status: AC

## 2013-09-27 MED ORDER — IPRATROPIUM BROMIDE 0.02 % IN SOLN
0.5000 mg | Freq: Four times a day (QID) | RESPIRATORY_TRACT | Status: DC
  Administered 2013-09-27: 0.5 mg via RESPIRATORY_TRACT
  Filled 2013-09-27: qty 2.5

## 2013-09-27 MED ORDER — BUDESONIDE 0.25 MG/2ML IN SUSP
0.2500 mg | Freq: Two times a day (BID) | RESPIRATORY_TRACT | Status: DC
  Administered 2013-09-27: 0.25 mg via RESPIRATORY_TRACT
  Filled 2013-09-27: qty 2

## 2013-09-27 MED ORDER — GUAIFENESIN-DM 100-10 MG/5ML PO SYRP: 5.0000 mL | ORAL_SOLUTION | ORAL | Status: AC | PRN

## 2013-09-27 MED ORDER — PREDNISONE 20 MG PO TABS: 20.0000 mg | ORAL_TABLET | Freq: Every day | ORAL | Status: AC | PRN

## 2013-09-27 MED ORDER — LORAZEPAM 0.5 MG PO TABS: 0.5000 mg | ORAL_TABLET | ORAL | Status: AC | PRN

## 2013-09-27 MED ORDER — ALUM & MAG HYDROXIDE-SIMETH 200-200-20 MG/5ML PO SUSP: 30.0000 mL | Freq: Four times a day (QID) | ORAL | Status: AC | PRN

## 2013-09-27 MED ORDER — BUDESONIDE 0.25 MG/2ML IN SUSP: 0.2500 mg | Freq: Two times a day (BID) | RESPIRATORY_TRACT | Status: AC

## 2013-09-27 NOTE — Progress Notes (Signed)
Nutrition Brief Note  Patient identified on the Malnutrition Screening Tool (MST) Report, low braden, and consult for severe malnutrition.   Wt Readings from Last 5 Encounters:  06/17/11 113 lb 12.8 oz (51.619 kg)  05/06/11 113 lb 6.4 oz (51.438 kg)  04/29/10 112 lb 12.8 oz (51.166 kg)  04/19/09 110 lb (49.896 kg)  10/24/08 118 lb 4 oz (53.638 kg)    There is no weight on file to calculate BMI.  Pt with end-stage COPD (GOLD IV) and chronic respiratory failure, under hospice care, was noted to be dyspneic and with altered mental status when her cousin checked in on her yesterday. The patient's cousin called her hospice nurse who went to her home to evaluate her and found her with extremely labored breathing, weak with inability to take her medications or take in any nutrition for the past 3 days. According to the patient's cousin and hospice social worker, her clinical condition has deteriorated rapidly over the past week.  Pt asleep and alone in room with venturi mask on. Appears thin. No current weight available. Noted prognosis in chart expected to be less than 2 weeks. Recommend comfort feeds per pt wishes. Per MD notes, pt on comfort care measures.   No nutrition interventions warranted at this time. If nutrition issues arise, please consult RD.   Carlis Stable MS, [place], LDN [phone] Pager [phone] Weekend/After Hours Pager

## 2013-09-27 NOTE — Care Management Note (Signed)
CARE MANAGEMENT NOTE 09/27/2013  Patient:  SALIA, CANGEMI   Account Number:  [id number]  Date Initiated:  09/27/2013  Documentation initiated by:  Leafy Kindle  Subjective/Objective Assessment:   73 yo pt admitted with resp failure. HX of COPD, chronic resp failure and DM     Action/Plan:   from home with hospice   Anticipated DC Date:  09/27/2013   Anticipated DC Plan:  [place]  In-house referral  Clinical Social Worker      DC Planning Services  CM consult      Choice offered to / List presented to:             Status of service:  Completed, signed off Medicare Important Message given?   (If response is "NO", the following Medicare IM given date fields will be blank) Date Medicare IM given:   Medicare IM given by:   Date Additional Medicare IM given:   Additional Medicare IM given by:    Discharge Disposition:    Per UR Regulation:  Reviewed for med. necessity/level of care/duration of stay  If discussed at [place] of Stay Meetings, dates discussed:    Comments:  09/27/13 Marney Doctor RN,BSN,NCM PT to DC to [facility] place today.  No CM DC needs.

## 2013-09-27 NOTE — Discharge Summary (Signed)
Physician Discharge Summary  Darlene [name] DJM:[id number] DOB: 04-26-1940 DOA: 09/26/2013  PCP: Leonard Downing, MD  Admit date: 09/26/2013 Discharge date: 09/27/2013  Time spent: >30 minutes  Recommendations for Outpatient Follow-up:  Comfort Measures and symptomatic treatment.  Discharge Diagnoses:  Principal Problem:   Acute on chronic respiratory failure Active Problems:   COPD exacerbation   Hyperglycemia   Leukocytosis   Thrombocytosis   Severe protein-calorie malnutrition   Anxiety   Diabetes   SVT (supraventricular tachycardia)   Dehydration   Discharge Condition: stable. Patient to be transferred to [facility] for Comfort Measures and symptomatic treatment.  Diet recommendation: comfort feeding  There were no vitals filed for this visit.  History of present illness:  73 y.o. female with end-stage COPD (GOLD IV) and chronic respiratory failure (home oxygen and steroid dependent since 07/2007), under hospice care, lives alone and whose cousin checks on her periodically, was noted to be dyspneic and with altered mental status when her cousin checked in on her today. The patient's cousin called her hospice nurse who went to her home to evaluate her and found her with extremely labored breathing, weak with inability to take her medications or take in any nutrition for the past 3 days. Patient requested admission to [facility], but no beds were available and so she was brought here for symptom management until a bed is available at [facility]. Patient is currently on CPAP and endorses dyspnea that has steadily worsened over the past few days. She is unkempt. She has a DO NOT RESUSCITATE form at the bedside. According to the patient's cousin, her clinical condition has deteriorated rapidly over the past week.   Hospital Course:  Acute on chronic respiratory failure secondary to COPD exacerbation  Will continue venturi mask for oxygen supplementation and  comfort Inhalers will be substituted by nebulizer therapy to guarantee delivery of medication and help with breathing/SOB.  Continue prednisone 20mg  daily( if able tot ake PO's) No antibiotics. Keep comfortable.  Prognosis poor. Transfer to residential hospice today for further symptomatic treatment and comfort care PRN morphine and Ativan initiated   Diabetes/Hyperglycemia  Given goals of care, would not check CBGs or provide insulin coverage.  Discontinue metformin and amaryl.  Comfort feeding   Leukocytosis  No antibiotics. No obvious pneumonia on chest radiography. Comfort care measures only.  Thrombocytosis  Likely reflective of dehydration and potential effect of chronic steroids  Severe protein-calorie malnutrition  Comfort feeding  Anxiety  Continue PRN Ativan every 3 hours when necessary ordered.  Continue Paxil as able to tolerate.  SVT (supraventricular tachycardia)  Reflective of acute respiratory distress, hypoxia, and anxiety.  Use Xopenex instead of albuterol to treat bronchospasm.  Use Ativan as needed for anxiety therapy.  Dehydration  Gently hydrated with IVF's Patient now allow to have comfort feeding and will be transfer to [facility] place for further symptoms manageement.  Procedures: See below for x-ray reports   Consultations:  None   Discharge Exam: Filed Vitals:   09/27/13 0550  BP: 140/65  Pulse:   Temp:   Resp: 22    General: frail, cachetic and chronically ill elderly woman; unable to speak in full sentences and with mild resp distress, no fever; in need of venturi mask for oxygen supplementation Cardiovascular: S1 and S2, no rubs or gallops Respiratory: diffuse rhonchi and exp wheezing, no rales Abd: soft, NT, ND, positive BS   Discharge Instructions You were cared for by a hospitalist during your hospital stay. If you  have any questions about your discharge medications or the care you received while you were in the hospital after you  are discharged, you can call the unit and asked to speak with the hospitalist on call if the hospitalist that took care of you is not available. Once you are discharged, your primary care physician will handle any further medical issues. Please note that NO REFILLS for any discharge medications will be authorized once you are discharged, as it is imperative that you return to your primary care physician (or establish a relationship with a primary care physician if you do not have one) for your aftercare needs so that they can reassess your need for medications and monitor your lab values.  Discharge Instructions   Discharge instructions    Complete by:  As directed   Continue adjusting medications as needed for symptoms control and comfort measures Comfort feeding Continue venturi mask for oxygen delivery/supplementation            Medication List    STOP taking these medications       albuterol 108 (90 BASE) MCG/ACT inhaler  Commonly known as:  PROVENTIL HFA;VENTOLIN HFA     budesonide-formoterol 160-4.5 MCG/ACT inhaler  Commonly known as:  SYMBICORT     CALCIUM + D PO     glimepiride 4 MG tablet  Commonly known as:  AMARYL     metFORMIN 500 MG tablet  Commonly known as:  GLUCOPHAGE     multivitamin with minerals Tabs tablet     PROBIOTIC PO     tiotropium 18 MCG inhalation capsule  Commonly known as:  SPIRIVA     traMADol 50 MG tablet  Commonly known as:  ULTRAM      TAKE these medications       alum & mag hydroxide-simeth 200-200-20 MG/5ML suspension  Commonly known as:  MAALOX/MYLANTA  Take 30 mLs by mouth every 6 (six) hours as needed for indigestion or heartburn (dyspepsia).     budesonide 0.25 MG/2ML nebulizer solution  Commonly known as:  PULMICORT  Take 2 mLs (0.25 mg total) by nebulization 2 (two) times daily.     clotrimazole 10 MG troche  Commonly known as:  MYCELEX  Take 1 tablet (10 mg total) by mouth 4 (four) times daily.      guaiFENesin-dextromethorphan 100-10 MG/5ML syrup  Commonly known as:  ROBITUSSIN DM  Take 5 mLs by mouth every 4 (four) hours as needed for cough.     ipratropium 0.02 % nebulizer solution  Commonly known as:  ATROVENT  Take 2.5 mLs (0.5 mg total) by nebulization 4 (four) times daily.     levalbuterol 0.63 MG/3ML nebulizer solution  Commonly known as:  XOPENEX  Take 3 mLs (0.63 mg total) by nebulization every 6 (six) hours.     LORazepam 0.5 MG tablet  Commonly known as:  ATIVAN  Take 1 tablet (0.5 mg total) by mouth every 3 (three) hours as needed for anxiety.     morphine CONCENTRATE 10 mg / 0.5 ml concentrated solution  Take 0.25 mLs (5 mg total) by mouth every 3 (three) hours as needed for severe pain or shortness of breath.     pantoprazole 40 MG tablet  Commonly known as:  PROTONIX  Take by mouth daily.     PARoxetine 10 MG tablet  Commonly known as:  PAXIL  Take 5-10 mg by mouth 2 (two) times daily. Take 5 mg every morning and 10 mg every evening.  predniSONE 20 MG tablet  Commonly known as:  DELTASONE  Take 1 tablet (20 mg total) by mouth daily as needed.       Allergies  Allergen Reactions  . Aspirin   . Hydrocodone-Acetaminophen   . Naproxen   . Olopatadine   . Sulfonamide Derivatives     The results of significant diagnostics from this hospitalization (including imaging, microbiology, ancillary and laboratory) are listed below for reference.    Significant Diagnostic Studies: Dg Chest Portable 1 View  09/26/2013   CLINICAL DATA:  Respiratory distress and emphysema  EXAM: PORTABLE CHEST - 1 VIEW  COMPARISON:  Jun 17, 2011  FINDINGS: There is underlying emphysema. There is widespread reticulonodular interstitial disease throughout the mid and lower lung zones. There is consolidation in the right mid lung.  Heart size is normal. Pulmonary vascularity reflects underlying emphysema. No adenopathy. No bone lesions.  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 This reticulonodular interstitial disease was not present previously. 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 Atypical presentation of congestive heart failure in atypical pneumonia are differential considerations. There is focal airspace consolidation in the right mid lung, most likely due to bacterial pneumonia. No change in cardiac silhouette.   Electronically Signed   By: Lowella Grip M.D.   On: 09/26/2013 16:04   Labs: Basic Metabolic Panel:  Recent Labs Lab 09/26/13 1541  NA 136*  K 4.1  CL 92*  CO2 21  GLUCOSE 172*  BUN 15  CREATININE 0.45*  CALCIUM 10.5   Liver Function Tests:  Recent Labs Lab 09/26/13 1541  AST 22  ALT 10  ALKPHOS 137*  BILITOT 0.6  PROT 7.9  ALBUMIN 2.4*   CBC:  Recent Labs Lab 09/26/13 1541  WBC 32.1*  NEUTROABS 26.6*  HGB 13.8  HCT 42.7  MCV 91.0  PLT 609*    Signed:  Barton Dubois  [facility] 09/27/2013, 11:00 AM

## 2013-09-27 NOTE — Progress Notes (Signed)
Home Care SW made visit with Patient to complete the financial assessment before Patient goes to [facility].  Patient was lethargic, but alert and able to sign form.  She had a hard time talking with her oxygen on.   SW spoke to [place], and she is aware that there is a bed and they are working on getting her to [facility] today.  SW spoke to cousin Trysten who has helped with care while Patient was at home and offered support.  SW will follow up with Patient tomorrow after she is admitted to [facility], [place] and [place] [facility] [place]

## 2013-09-27 NOTE — Progress Notes (Signed)
Clinical Social Work Department BRIEF PSYCHOSOCIAL ASSESSMENT 09/27/2013  Patient:  Darlene [name], Darlene [name]     Account Number:  [id number]     Admit date:  09/26/2013  Clinical Social Worker:  Earlie Server  Date/Time:  09/27/2013 11:30 AM  Referred by:  Physician  Date Referred:  09/27/2013 Referred for  Residential hospice placement   Other Referral:   Interview type:  Family Other interview type:    PSYCHOSOCIAL DATA Living Status:  ALONE Admitted from facility:   Level of care:   Primary support name:  [place] Primary support relationship to patient:  FAMILY Degree of support available:   Adequate    CURRENT CONCERNS Current Concerns  Post-Acute Placement   Other Concerns:    SOCIAL WORK ASSESSMENT / PLAN CSW received referral in order to assist with hospice placement. CSW reviewed chart and spoke with patient's hospice team. Patient has been accepted to [facility] today and CSW to assist with transportation.    CSW faxed DC summary to [facility] who is agreeable to accept. RN to call report. CSW spoke with cousin Shell) who is aware and agreeable to transfer.    CSW coordinated transportation via [place]. CSW is signing off but available if needed.   Assessment/plan status:  No Further Intervention Required Other assessment/ plan:   Information/referral to community resources:   Hospice referral    PATIENT'S/FAMILY'S RESPONSE TO PLAN OF CARE: Patient sleeping and unable to participate in assessment at this time. Cousin engaged and reports she is sad that patient declined so quickly but knows that she is receiving good care. Cousin reports she will go to patient's house to prepare belongings and will visit at [facility].       [place], [place] [phone]

## 2013-09-27 NOTE — Progress Notes (Signed)
Patient discharge to [facility], transported by [facility], discharge package given to transportation for delivery to [facility], patient in fair condition at this time

## 2013-09-27 NOTE — Progress Notes (Signed)
Clinical Social Work  CSW received referral in order to assist with transfer to residential hospice. CSW spoke with hospice SW Dyann Kief) who reports she will come to hospital to evaluate patient and call CSW afterwards. CSW will continue to follow and will keep medical team updated with plans.  [place], [place] [phone]

## 2013-10-03 ENCOUNTER — Telehealth: Payer: Self-pay

## 2013-10-03 NOTE — Telephone Encounter (Signed)
Rec'd Death Certificate patient died @ [facility]

## 2013-10-03 DEATH — deceased

## 2015-06-19 IMAGING — CR DG CHEST 1V PORT
1 series · 1 of 1 positions shown · non-contrast
Comparison: June 17, 2011

CLINICAL DATA: Respiratory distress and emphysema

EXAM:
PORTABLE CHEST - 1 VIEW

[AP]
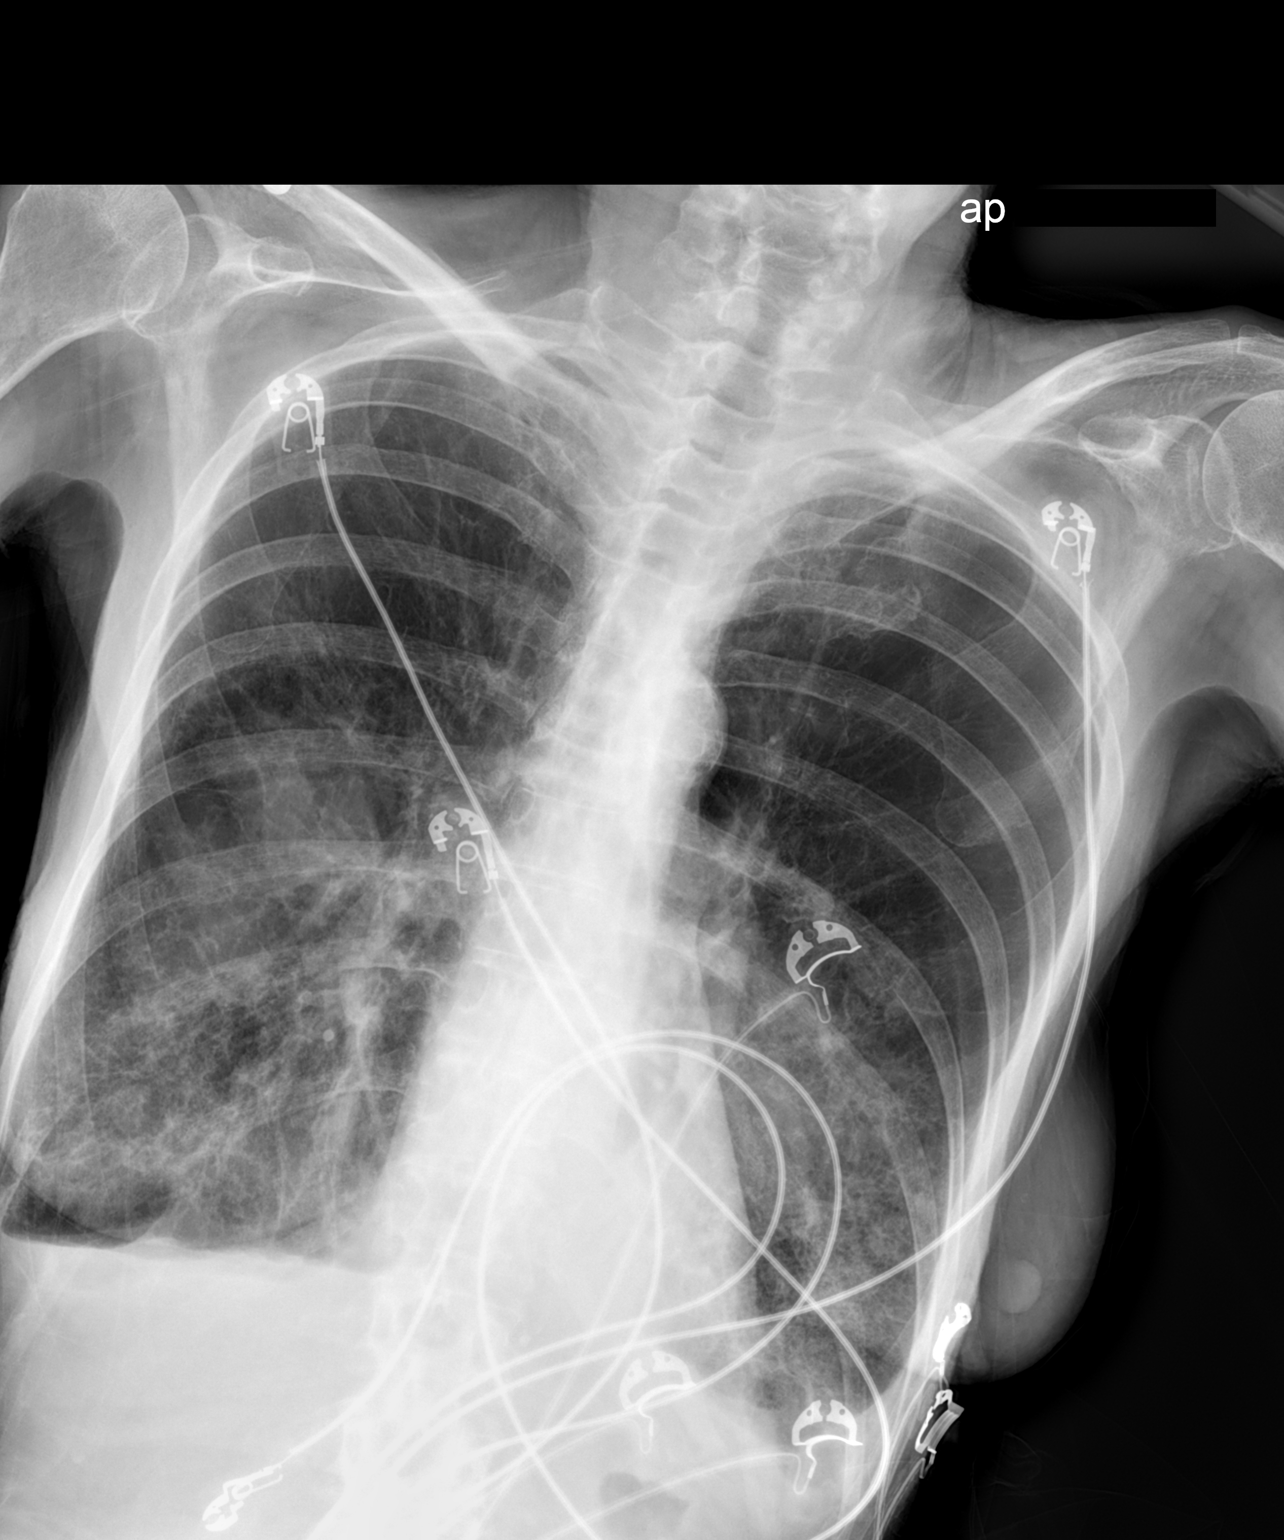

[1 of 1 positions shown; findings below may reference images not displayed]

FINDINGS: There is underlying emphysema. There is widespread reticulonodular
interstitial disease throughout the mid and lower lung zones. There
is consolidation in the right mid lung.

Heart size is normal. Pulmonary vascularity reflects underlying
emphysema. No adenopathy. No bone lesions.
IMPRESSION: Emphysema with widespread reticulonodular interstitial disease. This
reticulonodular interstitial disease was not present previously. The
appearance is consistent with an interstitial pneumonitis such as
UIP. Atypical presentation of congestive heart failure in atypical
pneumonia are differential considerations. There is focal airspace
consolidation in the right mid lung, most likely due to bacterial
pneumonia. No change in cardiac silhouette.
